# Patient Record
Sex: Female | Born: 1955 | Race: White | Hispanic: No | Marital: Married | Smoking: Never smoker
Health system: Southern US, Community
[De-identification: ages and names within clinical notes are randomized; demographics above are authoritative.]

## PROBLEM LIST (undated history)

## (undated) DIAGNOSIS — E039 Hypothyroidism, unspecified: Secondary | ICD-10-CM

## (undated) DIAGNOSIS — I480 Paroxysmal atrial fibrillation: Secondary | ICD-10-CM

## (undated) DIAGNOSIS — E785 Hyperlipidemia, unspecified: Secondary | ICD-10-CM

## (undated) DIAGNOSIS — M810 Age-related osteoporosis without current pathological fracture: Secondary | ICD-10-CM

## (undated) DIAGNOSIS — I4891 Unspecified atrial fibrillation: Secondary | ICD-10-CM

## (undated) HISTORY — PX: EYE SURGERY: SHX253

## (undated) HISTORY — DX: Hypothyroidism, unspecified: E03.9

## (undated) HISTORY — PX: WISDOM TOOTH EXTRACTION: SHX21

## (undated) HISTORY — PX: TONSILLECTOMY: SUR1361

## (undated) HISTORY — DX: Age-related osteoporosis without current pathological fracture: M81.0

## (undated) HISTORY — DX: Hyperlipidemia, unspecified: E78.5

## (undated) HISTORY — PX: UTERINE FIBROID SURGERY: SHX826

## (undated) HISTORY — DX: Paroxysmal atrial fibrillation: I48.0

## (undated) HISTORY — DX: Unspecified atrial fibrillation: I48.91

---

## 1998-09-01 ENCOUNTER — Other Ambulatory Visit: Admission: RE | Admit: 1998-09-01 | Discharge: 1998-09-01 | Payer: Self-pay | Admitting: Obstetrics and Gynecology

## 1999-10-18 ENCOUNTER — Other Ambulatory Visit: Admission: RE | Admit: 1999-10-18 | Discharge: 1999-10-18 | Payer: Self-pay | Admitting: Obstetrics and Gynecology

## 2000-10-17 ENCOUNTER — Other Ambulatory Visit: Admission: RE | Admit: 2000-10-17 | Discharge: 2000-10-17 | Payer: Self-pay | Admitting: Obstetrics and Gynecology

## 2001-10-17 ENCOUNTER — Other Ambulatory Visit: Admission: RE | Admit: 2001-10-17 | Discharge: 2001-10-17 | Payer: Self-pay | Admitting: Obstetrics and Gynecology

## 2002-04-03 ENCOUNTER — Ambulatory Visit (HOSPITAL_COMMUNITY): Admission: RE | Admit: 2002-04-03 | Discharge: 2002-04-03 | Payer: Self-pay | Admitting: Pulmonary Disease

## 2003-01-12 ENCOUNTER — Other Ambulatory Visit: Admission: RE | Admit: 2003-01-12 | Discharge: 2003-01-12 | Payer: Self-pay | Admitting: Obstetrics and Gynecology

## 2003-05-19 ENCOUNTER — Emergency Department (HOSPITAL_COMMUNITY): Admission: EM | Admit: 2003-05-19 | Discharge: 2003-05-19 | Payer: Self-pay | Admitting: Emergency Medicine

## 2003-05-19 ENCOUNTER — Encounter: Payer: Self-pay | Admitting: Emergency Medicine

## 2004-01-19 ENCOUNTER — Other Ambulatory Visit: Admission: RE | Admit: 2004-01-19 | Discharge: 2004-01-19 | Payer: Self-pay | Admitting: Obstetrics and Gynecology

## 2004-02-24 ENCOUNTER — Ambulatory Visit (HOSPITAL_COMMUNITY): Admission: RE | Admit: 2004-02-24 | Discharge: 2004-02-24 | Payer: Self-pay | Admitting: Family Medicine

## 2004-03-06 ENCOUNTER — Ambulatory Visit (HOSPITAL_COMMUNITY): Admission: RE | Admit: 2004-03-06 | Discharge: 2004-03-06 | Payer: Self-pay | Admitting: Family Medicine

## 2004-10-26 ENCOUNTER — Ambulatory Visit (HOSPITAL_COMMUNITY): Admission: RE | Admit: 2004-10-26 | Discharge: 2004-10-26 | Payer: Self-pay | Admitting: Pulmonary Disease

## 2005-05-23 ENCOUNTER — Other Ambulatory Visit: Admission: RE | Admit: 2005-05-23 | Discharge: 2005-05-23 | Payer: Self-pay | Admitting: Obstetrics and Gynecology

## 2009-07-04 ENCOUNTER — Ambulatory Visit (HOSPITAL_COMMUNITY): Admission: RE | Admit: 2009-07-04 | Discharge: 2009-07-04 | Payer: Self-pay | Admitting: Pulmonary Disease

## 2010-03-10 ENCOUNTER — Encounter (HOSPITAL_COMMUNITY): Admission: RE | Admit: 2010-03-10 | Discharge: 2010-04-09 | Payer: Self-pay | Admitting: Orthopaedic Surgery

## 2011-06-25 ENCOUNTER — Emergency Department (HOSPITAL_COMMUNITY)
Admission: EM | Admit: 2011-06-25 | Discharge: 2011-06-25 | Disposition: A | Discharge status: Home / Self Care | Payer: 59 | Attending: Emergency Medicine | Admitting: Emergency Medicine

## 2011-06-25 ENCOUNTER — Emergency Department (HOSPITAL_COMMUNITY): Payer: 59

## 2011-06-25 ENCOUNTER — Encounter: Payer: Self-pay | Admitting: Emergency Medicine

## 2011-06-25 DIAGNOSIS — R05 Cough: Secondary | ICD-10-CM | POA: Insufficient documentation

## 2011-06-25 DIAGNOSIS — R61 Generalized hyperhidrosis: Secondary | ICD-10-CM | POA: Insufficient documentation

## 2011-06-25 DIAGNOSIS — R059 Cough, unspecified: Secondary | ICD-10-CM | POA: Insufficient documentation

## 2011-06-25 DIAGNOSIS — J40 Bronchitis, not specified as acute or chronic: Secondary | ICD-10-CM | POA: Insufficient documentation

## 2011-06-25 DIAGNOSIS — R509 Fever, unspecified: Secondary | ICD-10-CM | POA: Insufficient documentation

## 2011-06-25 DIAGNOSIS — H9209 Otalgia, unspecified ear: Secondary | ICD-10-CM | POA: Insufficient documentation

## 2011-06-25 DIAGNOSIS — R6884 Jaw pain: Secondary | ICD-10-CM | POA: Insufficient documentation

## 2011-06-25 LAB — BASIC METABOLIC PANEL
BUN: 12 mg/dL (ref 6–23)
CO2: 30 mEq/L (ref 19–32)
Calcium: 9.4 mg/dL (ref 8.4–10.5)
Chloride: 98 mEq/L (ref 96–112)
Creatinine, Ser: 0.87 mg/dL (ref 0.50–1.10)
GFR calc non Af Amer: 74 mL/min — ABNORMAL LOW (ref >90)
Potassium: 3.9 mEq/L (ref 3.5–5.1)
Sodium: 139 mEq/L (ref 135–145)

## 2011-06-25 LAB — CBC
Hemoglobin: 13.6 g/dL (ref 12.0–15.0)
MCH: 29.4 pg (ref 26.0–34.0)
MCV: 89.6 fL (ref 78.0–100.0)
Platelets: 318 10*3/uL (ref 150–400)
RBC: 4.62 MIL/uL (ref 3.87–5.11)
WBC: 23.7 10*3/uL — ABNORMAL HIGH (ref 4.0–10.5)

## 2011-06-25 LAB — DIFFERENTIAL
Eosinophils Absolute: 0 10*3/uL (ref 0.0–0.7)
Eosinophils Relative: 0 % (ref 0–5)
Lymphocytes Relative: 5 % — ABNORMAL LOW (ref 12–46)
Monocytes Relative: 7 % (ref 3–12)

## 2011-06-25 MED ORDER — BENZONATATE 200 MG PO CAPS: 200.0000 mg | ORAL_CAPSULE | Freq: Three times a day (TID) | ORAL | Status: AC | PRN

## 2011-06-25 MED ORDER — MORPHINE SULFATE 4 MG/ML IJ SOLN
6.0000 mg | Freq: Once | INTRAMUSCULAR | Status: AC
  Administered 2011-06-25: 6 mg via INTRAVENOUS
  Filled 2011-06-25: qty 2

## 2011-06-25 MED ORDER — SODIUM CHLORIDE 0.9 % IV SOLN
INTRAVENOUS | Status: DC
  Administered 2011-06-25: 09:00:00 via INTRAVENOUS

## 2011-06-25 MED ORDER — OXYCODONE-ACETAMINOPHEN 5-325 MG PO TABS: 1.0000 | ORAL_TABLET | ORAL | Status: AC | PRN

## 2011-06-25 MED ORDER — ALBUTEROL 90 MCG/ACT IN AERS: 1.0000 | INHALATION_SPRAY | Freq: Four times a day (QID) | RESPIRATORY_TRACT | Status: DC | PRN

## 2011-06-25 NOTE — ED Provider Notes (Signed)
History     CSN: [id number] Arrival date & time: 06/25/2011  7:18 AM  Chief Complaint  Patient presents with  . Otalgia  . Cough    (Consider location/radiation/quality/duration/timing/severity/associated sxs/prior treatment) HPI Comments: This is a 55 YO Caucasian female presenting with cough and other cold symptoms for the past week and now last start, started experiencing severe right-sided facial pain.  Has been having non-productive cough, subjective fevers, and chills. No nausea/vomiting/diarrhea. No rash. No difficulty breathing. Patient does not smoke.   Last night, started experiencing significant right-sided facial pain. Difficult opening mouth and has been having difficulty eating because of this. Denies difficulty with vision or hearing.  Has only taken Alka-Seltzer cold. No other medications.    The history is provided by the patient and the spouse.    Past Medical History  Diagnosis Date  . Thyroid disease     Past Surgical History  Procedure Date  . Tonsillectomy     History reviewed. No pertinent family history.  History  Substance Use Topics  . Smoking status: Never Smoker   . Smokeless tobacco: Not on file  . Alcohol Use: No    OB History    Grav Para Term Preterm Abortions TAB SAB Ect Mult Living                  Review of Systems  Constitutional: Positive for fever, chills and diaphoresis.  HENT: Positive for congestion and rhinorrhea. Negative for hearing loss, ear pain, sore throat, facial swelling, sneezing, mouth sores, trouble swallowing, neck pain, neck stiffness, dental problem, sinus pressure, tinnitus and ear discharge.   Eyes: Negative for photophobia, pain, discharge, redness, itching and visual disturbance.  Respiratory: Positive for cough. Negative for choking, chest tightness, shortness of breath and wheezing.   Cardiovascular: Negative for chest pain and palpitations.  Gastrointestinal: Negative for nausea, vomiting, diarrhea  and constipation.  Neurological: Negative for speech difficulty, weakness, numbness and headaches.  Hematological: Negative for adenopathy.    Allergies  Penicillins  Home Medications  No current outpatient prescriptions on file.  BP 95/64  Pulse 87  Temp(Src) 98.9 F (37.2 C) (Axillary)  Resp 18  SpO2 100%  Physical Exam  Constitutional: She appears distressed.  HENT:  Head: Normocephalic and atraumatic.  Right Ear: Hearing, tympanic membrane, external ear and ear canal normal. No drainage or tenderness. No mastoid tenderness. Tympanic membrane is not injected, not scarred, not perforated, not erythematous, not retracted and not bulging. No middle ear effusion. No decreased hearing is noted.  Left Ear: Hearing, tympanic membrane, external ear and ear canal normal. No drainage or tenderness. No mastoid tenderness. Tympanic membrane is not injected, not scarred, not perforated, not erythematous, not retracted and not bulging.  No middle ear effusion. No decreased hearing is noted.  Nose: Nose normal. No mucosal edema or rhinorrhea.       Point tenderness on right face near right TMJ joint but not at joint.   Examination deferred. Patient unable to open her mouth due to pain.     Lymphadenopathy:    She has no cervical adenopathy.  Skin: She is not diaphoretic.    ED Course  Procedures (including critical care time)  Labs Reviewed - No data to display Dg Chest 2 View  06/25/2011  *RADIOLOGY REPORT*  Clinical Data: Cough.  CHEST - 2 VIEW  Comparison: None.  Findings: There is hyperinflation of the lungs compatible with COPD.  Scarring in the upper lobes. Heart and mediastinal contours are  within normal limits.  No focal opacities or effusions.  No acute bony abnormality.  IMPRESSION: COPD. No active cardiopulmonary disease.  Original Report Authenticated By: Cyndie Chime, M.D.     No diagnosis found.    MDM  This is a 55 YO Caucasian female whose past medical history  is only significant for thyroid problems that are stable on medications and who presents with cough and cold symptoms x 1 week and severe right-sided maxillary pain x 1 day.         Lucianne Muss [place] Resident 06/25/11 [phone]

## 2011-06-25 NOTE — ED Notes (Signed)
Prod cough noted.

## 2011-06-25 NOTE — ED Notes (Signed)
Pt c/o cough/congestion. Cold sx's started yesterday. Pain to r side of ear and jaw. States jaw is locked. Pt unable to open for temp.  nad at this itme. Pain worse with swallowing. Denies sorethroat.

## 2011-06-26 NOTE — ED Provider Notes (Signed)
I have personally seen and examined the patient.  I have discussed the plan of care with the resident.  I have reviewed the documentation on PMH/FH/Soc. History.  I have reviewed the documentation of the resident and agree. Seen by me pt with jaw pain and trunble opening mouth for severla days , als o with cough for severla day. . On exam alert handing secretions well non tooxic, heent positive trismus , bilat tms normal lungs cta,.   Pt's trismus resolved after iv pain medciation  Doug Sou, MD 06/26/11 1054

## 2011-08-02 ENCOUNTER — Other Ambulatory Visit (HOSPITAL_COMMUNITY): Payer: Self-pay | Admitting: Pulmonary Disease

## 2011-08-02 ENCOUNTER — Ambulatory Visit (HOSPITAL_COMMUNITY)
Admission: RE | Admit: 2011-08-02 | Discharge: 2011-08-02 | Disposition: A | Discharge status: Home / Self Care | Payer: 59 | Source: Ambulatory Visit | Attending: Pulmonary Disease | Admitting: Pulmonary Disease

## 2011-08-02 DIAGNOSIS — R52 Pain, unspecified: Secondary | ICD-10-CM

## 2011-08-02 DIAGNOSIS — M25539 Pain in unspecified wrist: Secondary | ICD-10-CM | POA: Insufficient documentation

## 2011-08-02 DIAGNOSIS — M25439 Effusion, unspecified wrist: Secondary | ICD-10-CM | POA: Insufficient documentation

## 2011-08-22 ENCOUNTER — Emergency Department (HOSPITAL_COMMUNITY): Payer: 59

## 2011-08-22 ENCOUNTER — Other Ambulatory Visit: Payer: Self-pay

## 2011-08-22 ENCOUNTER — Observation Stay (HOSPITAL_COMMUNITY)
Admission: EM | Admit: 2011-08-22 | Discharge: 2011-08-23 | DRG: 310 | Disposition: A | Discharge status: Home / Self Care | Payer: 59 | Attending: Pulmonary Disease | Admitting: Pulmonary Disease

## 2011-08-22 ENCOUNTER — Encounter (HOSPITAL_COMMUNITY): Payer: Self-pay | Admitting: *Deleted

## 2011-08-22 DIAGNOSIS — R55 Syncope and collapse: Secondary | ICD-10-CM | POA: Diagnosis present

## 2011-08-22 DIAGNOSIS — I471 Supraventricular tachycardia: Secondary | ICD-10-CM

## 2011-08-22 DIAGNOSIS — E039 Hypothyroidism, unspecified: Secondary | ICD-10-CM | POA: Diagnosis present

## 2011-08-22 DIAGNOSIS — I4892 Unspecified atrial flutter: Secondary | ICD-10-CM | POA: Diagnosis present

## 2011-08-22 DIAGNOSIS — I480 Paroxysmal atrial fibrillation: Secondary | ICD-10-CM | POA: Diagnosis present

## 2011-08-22 DIAGNOSIS — J111 Influenza due to unidentified influenza virus with other respiratory manifestations: Secondary | ICD-10-CM | POA: Diagnosis present

## 2011-08-22 DIAGNOSIS — J069 Acute upper respiratory infection, unspecified: Secondary | ICD-10-CM

## 2011-08-22 DIAGNOSIS — I4891 Unspecified atrial fibrillation: Secondary | ICD-10-CM | POA: Diagnosis present

## 2011-08-22 DIAGNOSIS — I498 Other specified cardiac arrhythmias: Principal | ICD-10-CM | POA: Diagnosis present

## 2011-08-22 DIAGNOSIS — E876 Hypokalemia: Secondary | ICD-10-CM | POA: Diagnosis present

## 2011-08-22 LAB — DIFFERENTIAL
Basophils Relative: 0 % (ref 0–1)
Eosinophils Relative: 0 % (ref 0–5)
Monocytes Relative: 7 % (ref 3–12)
Neutrophils Relative %: 87 % — ABNORMAL HIGH (ref 43–77)

## 2011-08-22 LAB — CARDIAC PANEL(CRET KIN+CKTOT+MB+TROPI)
Relative Index: 1.1 (ref 0.0–2.5)
Relative Index: 0.9 (ref 0.0–2.5)
Total CK: 281 U/L — ABNORMAL HIGH (ref 7–177)

## 2011-08-22 LAB — CBC
MCV: 89.8 fL (ref 78.0–100.0)
RBC: 4.3 MIL/uL (ref 3.87–5.11)

## 2011-08-22 LAB — INFLUENZA PANEL BY PCR (TYPE A & B)
H1N1 flu by pcr: NOT DETECTED
Influenza B By PCR: NEGATIVE

## 2011-08-22 LAB — COMPREHENSIVE METABOLIC PANEL
Alkaline Phosphatase: 54 U/L (ref 39–117)
Chloride: 102 mEq/L (ref 96–112)
GFR calc Af Amer: >90 mL/min (ref >90)
GFR calc non Af Amer: 80 mL/min — ABNORMAL LOW (ref >90)
Potassium: 2.9 mEq/L — ABNORMAL LOW (ref 3.5–5.1)
Total Bilirubin: 0.2 mg/dL — ABNORMAL LOW (ref 0.3–1.2)

## 2011-08-22 LAB — POCT I-STAT TROPONIN I: Troponin i, poc: 0.01 ng/mL (ref 0.00–0.08)

## 2011-08-22 LAB — MRSA PCR SCREENING: MRSA by PCR: NEGATIVE

## 2011-08-22 MED ORDER — POTASSIUM CHLORIDE 20 MEQ/15ML (10%) PO LIQD
ORAL | Status: AC
  Filled 2011-08-22: qty 60

## 2011-08-22 MED ORDER — ALUM & MAG HYDROXIDE-SIMETH 200-200-20 MG/5ML PO SUSP
30.0000 mL | Freq: Four times a day (QID) | ORAL | Status: DC | PRN
Start: 2011-08-22 — End: 2011-08-23

## 2011-08-22 MED ORDER — POTASSIUM CHLORIDE CRYS ER 20 MEQ PO TBCR
60.0000 meq | EXTENDED_RELEASE_TABLET | Freq: Once | ORAL | Status: DC
  Filled 2011-08-22: qty 3

## 2011-08-22 MED ORDER — POTASSIUM CHLORIDE 20 MEQ/15ML (10%) PO LIQD
60.0000 meq | Freq: Once | ORAL | Status: AC
  Administered 2011-08-22: 60 meq via ORAL

## 2011-08-22 MED ORDER — HYDROCODONE-ACETAMINOPHEN 5-325 MG PO TABS: 1.0000 | ORAL_TABLET | ORAL | Status: DC | PRN

## 2011-08-22 MED ORDER — ACETAMINOPHEN 325 MG PO TABS: 650.0000 mg | ORAL_TABLET | Freq: Four times a day (QID) | ORAL | Status: DC | PRN

## 2011-08-22 MED ORDER — ESTRADIOL-NORETHINDRONE ACET 0.05-0.14 MG/DAY TD PTTW: 1.0000 | MEDICATED_PATCH | TRANSDERMAL | Status: DC

## 2011-08-22 MED ORDER — SODIUM CHLORIDE 0.9 % IV SOLN
INTRAVENOUS | Status: DC
  Administered 2011-08-22: 21:00:00 via INTRAVENOUS
  Administered 2011-08-22: 125 mL/h via INTRAVENOUS
  Administered 2011-08-23: 05:00:00 via INTRAVENOUS

## 2011-08-22 MED ORDER — LEVOTHYROXINE SODIUM 25 MCG PO TABS
50.0000 ug | ORAL_TABLET | Freq: Every day | ORAL | Status: DC
  Administered 2011-08-23: 50 ug via ORAL
  Filled 2011-08-22 (×2): qty 2

## 2011-08-22 MED ORDER — ZOLPIDEM TARTRATE 5 MG PO TABS: 5.0000 mg | ORAL_TABLET | Freq: Every evening | ORAL | Status: DC | PRN

## 2011-08-22 MED ORDER — ONDANSETRON HCL 4 MG PO TABS: 4.0000 mg | ORAL_TABLET | Freq: Four times a day (QID) | ORAL | Status: DC | PRN

## 2011-08-22 MED ORDER — ONDANSETRON HCL 4 MG/2ML IJ SOLN: 4.0000 mg | Freq: Four times a day (QID) | INTRAMUSCULAR | Status: DC | PRN

## 2011-08-22 MED ORDER — OSELTAMIVIR PHOSPHATE 75 MG PO CAPS
75.0000 mg | ORAL_CAPSULE | Freq: Two times a day (BID) | ORAL | Status: DC
  Administered 2011-08-22 – 2011-08-23 (×3): 75 mg via ORAL
  Filled 2011-08-22 (×3): qty 1

## 2011-08-22 MED ORDER — ENOXAPARIN SODIUM 40 MG/0.4ML ~~LOC~~ SOLN
40.0000 mg | SUBCUTANEOUS | Status: DC
  Administered 2011-08-22: 40 mg via SUBCUTANEOUS
  Filled 2011-08-22: qty 0.4

## 2011-08-22 MED ORDER — ACETAMINOPHEN 650 MG RE SUPP: 650.0000 mg | Freq: Four times a day (QID) | RECTAL | Status: DC | PRN

## 2011-08-22 MED ORDER — MOXIFLOXACIN HCL IN NACL 400 MG/250ML IV SOLN
400.0000 mg | INTRAVENOUS | Status: DC
  Administered 2011-08-22: 400 mg via INTRAVENOUS
  Filled 2011-08-22 (×2): qty 250

## 2011-08-22 NOTE — Progress Notes (Addendum)
Lab called with critical. Patient influenza screen came back positive for Influenza A.

## 2011-08-22 NOTE — ED Notes (Signed)
Pt passed out at home per family at approximately 0645. Pt had CPR by family for 5 minutes but pulselessness was not confirmed. Pt was alert and oriented on arrival by EMS. Pt was in rapid A fib vs SVT and was given Adenosine 6 mg at 0700 and 12 mg at 0703. Pt remained in SVT and was given Cardizem 20 mg at 0715. Pt converted to A fib with rate of 104. Pt alert and oriented x 3 on arrival to ED. CCM showing NSR with rate of 85. Denies chest pain or shortness of breath. Pt states that she has been sick and had a fever up to 102.5 off and on for the last couple days.

## 2011-08-22 NOTE — ED Notes (Signed)
Pt states that she has been sick with a fever and nausea x 2 days. Pt states that she got up this morning and remembers sitting down at the table and then does not remember anything else. CCM showing NSR with rate 85. Alert and oriented x 3. Skin warm and dry. Color pink. Husband at bedside.

## 2011-08-22 NOTE — ED Provider Notes (Signed)
History   This chart was scribed for [employer]. Colon Branch, MD by Sofie Rower. The patient was seen in room [facility]18/[facility]18 and the patient's care was started at 7:57AM.    CSN: [id number] Arrival date & time: 08/22/2011  7:38 AM   First MD Initiated Contact with Patient 08/22/11 [phone]      Chief Complaint  Patient presents with  . Loss of Consciousness    (Consider location/radiation/quality/duration/timing/severity/associated sxs/prior treatment) HPI  MARGARETHA MAHAN is a 55 y.o. female who presents to the Emergency Department after BIB EMS complaining of a severe episode of loss of consciousness this morning with associated symptoms including cough, weakness, palpitations, and headache to posterior aspect of head. Pt. Denies nausea and sore throat.   Pt. States she was sitting at the table drinking pepsi, where her husband heard her groan. Husband states she was not displaying any seizure activity with LOC but was unresponsive and was disoriented when she came to. Pt's Husband began administering CPR with chest compression and rescue breaths at time of LOC. Patient also notes experiencing fever measured as high as 102.7, chills and cough for the past several days prior to episode of LOC this morning.  PCP is Dr. Juanetta Gosling.  Past Medical History  Diagnosis Date  . Thyroid disease     Past Surgical History  Procedure Date  . Tonsillectomy     History reviewed. No pertinent family history.  History  Substance Use Topics  . Smoking status: Never Smoker   . Smokeless tobacco: Not on file  . Alcohol Use: No    OB History    Grav Para Term Preterm Abortions TAB SAB Ect Mult Living                  Review of Systems  10 Systems reviewed and are negative for acute change except as noted in the HPI.   Allergies  Other and Penicillins  Home Medications   Current Outpatient Rx  Name Route Sig Dispense Refill  . ESTRADIOL-NORETHINDRONE ACET 0.05-0.14 MG/DAY TD PTTW Transdermal Place 1  patch onto the skin 2 (two) times a week. saturdays     . LEVOTHYROXINE SODIUM 50 MCG PO TABS Oral Take 50 mcg by mouth daily.      . ALBUTEROL 90 MCG/ACT IN AERS Inhalation Inhale 1-2 puffs into the lungs every 6 (six) hours as needed for wheezing (Cough). 17 g 1    BP 105/68  Pulse 91  Temp(Src) 98.5 F (36.9 C) (Oral)  Resp 18  Ht 5\' 5"  (1.651 m)  Wt 110 lb (49.896 kg)  BMI 18.31 kg/m2  SpO2 96%  Physical Exam  Nursing note and vitals reviewed. Constitutional: She is oriented to person, place, and time. She appears well-developed and well-nourished. No distress.  HENT:  Head: Normocephalic and atraumatic.  Mouth/Throat: Oropharynx is clear and moist.  Eyes: EOM are normal. Pupils are equal, round, and reactive to light.  Neck: Normal range of motion. Neck supple. No tracheal deviation present.  Cardiovascular: Normal rate and regular rhythm.  Exam reveals no gallop and no friction rub.   No murmur heard. Pulmonary/Chest: Effort normal. No respiratory distress. She has wheezes (left upper lobe). She has rales (crackles at both bases bilaterally).  Abdominal: Soft. Bowel sounds are normal. She exhibits no distension.  Musculoskeletal: Normal range of motion. She exhibits no edema.  Lymphadenopathy:    She has no cervical adenopathy.  Neurological: She is alert and oriented to person, place, and time. No  sensory deficit.  Skin: Skin is warm and dry.  Psychiatric: She has a normal mood and affect. Her behavior is normal.    ED Course  Procedures (including critical care time)     Results for orders placed during the hospital encounter of 06/25/11  BASIC METABOLIC PANEL      Component Value Range   Sodium 139  135 - 145 (mEq/L)   Potassium 3.9  3.5 - 5.1 (mEq/L)   Chloride 98  96 - 112 (mEq/L)   CO2 30  19 - 32 (mEq/L)   Glucose, Bld 136 (*) 70 - 99 (mg/dL)   BUN 12  6 - 23 (mg/dL)   Creatinine, Ser 1.61  0.50 - 1.10 (mg/dL)   Calcium 9.4  8.4 - 09.6 (mg/dL)   GFR  calc non Af Amer 74 (*) >90 (mL/min)   GFR calc Af Amer 85 (*) >90 (mL/min)  CBC      Component Value Range   WBC 23.7 (*) 4.0 - 10.5 (K/uL)   RBC 4.62  3.87 - 5.11 (MIL/uL)   Hemoglobin 13.6  12.0 - 15.0 (g/dL)   HCT 04.5  40.9 - 81.1 (%)   MCV 89.6  78.0 - 100.0 (fL)   MCH 29.4  26.0 - 34.0 (pg)   MCHC 32.9  30.0 - 36.0 (g/dL)   RDW 91.4  78.2 - 95.6 (%)   Platelets 318  150 - 400 (K/uL)  DIFFERENTIAL      Component Value Range   Neutrophils Relative 88 (*) 43 - 77 (%)   Neutro Abs 20.8 (*) 1.7 - 7.7 (K/uL)   Lymphocytes Relative 5 (*) 12 - 46 (%)   Lymphs Abs 1.2  0.7 - 4.0 (K/uL)   Monocytes Relative 7  3 - 12 (%)   Monocytes Absolute 1.6 (*) 0.1 - 1.0 (K/uL)   Eosinophils Relative 0  0 - 5 (%)   Eosinophils Absolute 0.0  0.0 - 0.7 (K/uL)   Basophils Relative 0  0 - 1 (%)   Basophils Absolute 0.1  0.0 - 0.1 (K/uL)    DIAGNOSTIC STUDIES: Oxygen Saturation is 96% on room air, normal by my interpretation.    Date: 08/22/2011  2130  Rate: 90  Rhythm: normal sinus rhythm  QRS Axis: normal  Intervals: normal  ST/T Wave abnormalities: normal and nonspecific ST changes  Conduction Disutrbances:right bundle branch block  Narrative Interpretation:   Old EKG Reviewed: none available No results found. COORDINATION OF CARE:  8:03AM- EDP at bedside discusses treatment plan. 9:03AM- Consult compelte with Dr. Lendon Colonel. Dr. Juanetta Gosling agrees to evaluate patient in ED 9:15AM- Dr. Juanetta Gosling at patient bedside. Will admit patient to step-down.   MDM  Patient with recent URI symptoms with fevers had syncope with unresponsiveness. Husband initiated CPR with restoration of response. EMS responded and found patient in atrial fib vs SVT. Tried adenosine without conversion, gave cardiezem with conversion to NSR. Patient in NSR upon arrival in ER. Initiated IVF, labs unresmarkable , EKG normal. Spoke with Dr. Blase Mess who will see and admit patient from the ER.  I personally performed the services  described in this documentation, which was scribed in my presence. The recorded information has been reviewed and considered.    MDM Reviewed: previous chart, nursing note and vitals Reviewed previous: ECG Interpretation: labs, ECG and x-ray Total time providing critical care: 35. Consults: primary care provider          Nicoletta Dress. Colon Branch, MD 08/24/11 1112

## 2011-08-22 NOTE — H&P (Signed)
Allison Koch MRN: [id number] DOB/AGE: 55-22-1957 55 y.o. Primary Care Physician:Henrry Feil L, MD Admit date: 08/22/2011 Chief Complaint: Supraventricular tachycardia HPI: This is a 55 year old who has been battling an upper respiratory infection which may be influenza. She has had a temperature as high as 102 has been coughing and aching all over. She has been taking over the Alka-Seltzer. This morning her husband heard her cry out and went downstairs and found her collapsed. He could not tell that she was breathing. He called for 911 and started CPR. He did not see if she had a pulse at that time. He clear lung secretions out of her throat and she started to come around. When the EMS arrived she was found to be in a supraventricular tachycardia with a rapid response.  Past Medical History  Diagnosis Date  . Thyroid disease    Past Surgical History  Procedure Date  . Tonsillectomy         History reviewed. No pertinent family history. Her mother has had atrial fibrillation and has COPD Social History:  reports that she has never smoked. She does not have any smokeless tobacco history on file. She reports that she does not drink alcohol or use illicit drugs. She lives at home with her husband  Allergies:  Allergies  Allergen Reactions  . Other     Mycins per patient.  . Penicillins     Medications Prior to Admission  Medication Dose Route Frequency Provider Last Rate Last Dose  . potassium chloride 20 MEQ/15ML (10%) liquid 60 mEq  60 mEq Oral Once [employer]. Colon Branch, MD      . DISCONTD: potassium chloride 20 MEQ/15ML (10%) liquid           . DISCONTD: potassium chloride SA (K-DUR,KLOR-CON) CR tablet 60 mEq  60 mEq Oral Once [employer]. Colon Branch, MD       Medications Prior to Admission  Medication Sig Dispense Refill  . estradiol-norethindrone (COMBIPATCH) 0.05-0.14 MG/DAY Place 1 patch onto the skin 2 (two) times a week. saturdays       . levothyroxine (SYNTHROID, LEVOTHROID) 50  MCG tablet Take 50 mcg by mouth daily.        Marland Kitchen albuterol (PROVENTIL,VENTOLIN) 90 MCG/ACT inhaler Inhale 1-2 puffs into the lungs every 6 (six) hours as needed for wheezing (Cough).  17 g  1       NWG:NFAOZ from the symptoms mentioned above,there are no other symptoms referable to all systems reviewed.  Physical Exam: Blood pressure 105/68, pulse 91, temperature 98.5 F (36.9 C), temperature source Oral, resp. rate 18, height 5\' 5"  (1.651 m), weight 49.896 kg (110 lb), SpO2 96.00%. She is awake and alert. Her pupils are reactive. Her mucous membranes are slightly dry. Her neck is supple without masses. Her chest is clear. Her heart is regular without gallop. She is now in sinus rhythm. Her abdomen is soft without masses. Bowel sounds present and active. Her extremities showed no clubbing cyanosis or edema. Her central nervous system examination is grossly intact.    Basename 08/22/11 0828  WBC 10.5  NEUTROABS 9.2*  HGB 13.1  HCT 38.6  MCV 89.8  PLT 203    Basename 08/22/11 0828  NA 139  K 2.9*  CL 102  CO2 28  GLUCOSE 135*  BUN 8  CREATININE 0.81  CALCIUM 9.1  MG --  lablast2(ast:2,ALT:2,alkphos:2,bilitot:2,prot:2,albumin:2)@    No results found for this or any previous visit (from the past 240 hour(s)).   Dg Wrist Complete  Left  08/02/2011  *RADIOLOGY REPORT*  Clinical Data: 55 year old female with pain and swelling.  LEFT WRIST - COMPLETE 3+ VIEW  Comparison: None.  Findings: Distal left radius and ulna are intact.  There is radiocarpal joint space narrowing with subchondral sclerosis. There is mild intracarpal joint space narrowing with subchondral sclerosis.  Carpometacarpal joint spaces appear preserved.  Carpal bone alignment is within normal limits.  The scaphoid appears intact.  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  Original Report Authenticated By: Harley Hallmark, M.D.   Dg Chest Port  1 View  08/22/2011  *RADIOLOGY REPORT*  Clinical Data: Supraventricular tachycardia.  Chest pain.  PORTABLE CHEST - 1 VIEW  Comparison: 06/25/2011  Findings: Midline trachea.  Normal heart size and mediastinal contours for age.  No pleural effusion or pneumothorax.  COPD with biapical pleural parenchymal scarring.  This is unchanged.  No superimposed airspace disease. No congestive failure.  IMPRESSION: COPD with biapical pleural parenchymal scarring. No acute findings.  Original Report Authenticated By: Consuello Bossier, M.D.   Impression: She has supraventricular tachycardia. She is hypokalemic. She had a previous episode about 2 months ago where she almost passed out. That may have been a previous episode of SVT. She has a history of thyroid disease we'll need to check her TSH. Active Problems:  * No active hospital problems. *      Plan: She'll be admitted to step down have cycled cardiac enzymes. She needs an echocardiogram. She will be treated as if she has influenza. She will have cardiology consultation      Legend Pecore L 08/22/2011, 9:31 AM

## 2011-08-22 NOTE — ED Notes (Signed)
Pt transported to ICU via stretcher.

## 2011-08-22 NOTE — Consult Note (Signed)
Reason for Consult SVT syncope  Referring Physician: Cyndy Braver is an 55 y.o. female.  HPI: This is a 55 year old white female patient who has no prior cardiac history. She has been battling an upper respiratory infection with nonproductive cough and fever as high as 102.  This morning her husband heard her cry out and went downstairs and found her collapsed in her chair.  Patient recalls coming to the kitchen to pour something to drink and taking a few swallows. She does not remember lightheadedness or any other symptoms prior to loss of consciousness.   He did not detect a definite respiratory effort, called for 911 and then started CPR. He did not check for a pulse. He cleared secretions out of her throat, after which she regained consciousness. When the EMS arrived she was found to be in a supraventricular tachycardia with a rapid response. They gave her adenosine 18 mg, which resulted in marked slowing of her heart rate and emergency clear flutter waves.   3 minutes later, apparently after a second dose of adenosine, there was a 10 second pause with an underlying rhythm of atrial fibrillation.   She subsequently was given Cardizem 20 mg.  Eventually, she converted to sinus rhythm.  The patient states she awakened 45 minutes prior to this episode to go to the bathroom and felt dizzy like she was going to black out but it passed. She also noticed her heart was racing at that time. She did not feel her heart racing when she collapsed. She denies any prior history of palpitations, chest pain, dyspnea, dyspnea on exertion, dizziness, or presyncope. She was taking Alka-Seltzer cold for her cold symptoms.  On arrival to the hospital her potassium was 2.9.  She claims that oral intake was preserved throughout her illness, but that she may have concentrated more on fluids than food.  Past Medical History  Diagnosis Date  . Thyroid disease    Past Surgical History  Procedure Date  .  Tonsillectomy   Family history: patient's mother has a history of atrial fibrillation and is followed by Dr. Daleen Squibb, patient's father had CABG and died of heart failure.  Social History:  reports that she has never smoked. She does not have any smokeless tobacco history on file. She reports that she does not drink alcohol or use illicit drugs.  Allergies:  Allergies  Allergen Reactions  . Other     Mycins per patient.  . Penicillins    Results for orders placed during the hospital encounter of 08/22/11 (from the past 48 hour(s))  CBC     Status: Normal   Collection Time   08/22/11  8:28 AM      Component Value Range Comment   WBC 10.5  4.0 - 10.5 (K/uL)    RBC 4.30  3.87 - 5.11 (MIL/uL)    Hemoglobin 13.1  12.0 - 15.0 (g/dL)    HCT 16.1  09.6 - 04.5 (%)    MCV 89.8  78.0 - 100.0 (fL)    MCH 30.5  26.0 - 34.0 (pg)    MCHC 33.9  30.0 - 36.0 (g/dL)    RDW 40.9  81.1 - 91.4 (%)    Platelets 203  150 - 400 (K/uL)   COMPREHENSIVE METABOLIC PANEL     Status: Abnormal   Collection Time   08/22/11  8:28 AM      Component Value Range Comment   Sodium 139  135 - 145 (mEq/L)  Potassium 2.9 (*) 3.5 - 5.1 (mEq/L)    Chloride 102  96 - 112 (mEq/L)    CO2 28  19 - 32 (mEq/L)    Glucose, Bld 135 (*) 70 - 99 (mg/dL)    BUN 8  6 - 23 (mg/dL)    Creatinine, Ser 1.61  0.50 - 1.10 (mg/dL)    Calcium 9.1  8.4 - 10.5 (mg/dL)    Total Protein 6.5  6.0 - 8.3 (g/dL)    Albumin 3.3 (*) 3.5 - 5.2 (g/dL)    AST 26  0 - 37 (U/L)    ALT 9  0 - 35 (U/L)    Alkaline Phosphatase 54  39 - 117 (U/L)    Total Bilirubin 0.2 (*) 0.3 - 1.2 (mg/dL)    GFR calc non Af Amer 80 (*) >90 (mL/min)    GFR calc Af Amer >90  >90 (mL/min)   POCT I-STAT TROPONIN I     Status: Normal   Collection Time   08/22/11  8:51 AM      Component Value Range Comment   Troponin i, poc 0.01  0.00 - 0.08 (ng/mL)    Comment 3            CARDIAC PANEL(CRET KIN+CKTOT+MB+TROPI)     Status: Abnormal   Collection Time   08/22/11 11:36 AM        Component Value Range Comment   Total CK 281 (*) 7 - 177 (U/L)    CK, MB 3.2  0.3 - 4.0 (ng/mL)    Troponin I <0.30  <0.30 (ng/mL)    Relative Index 1.1  0.0 - 2.5      Dg Chest Port 1 View  08/22/2011  *RADIOLOGY REPORT*  Clinical Data: Supraventricular tachycardia.  Chest pain.  PORTABLE CHEST - 1 VIEW  Comparison: 06/25/2011  Findings: Midline trachea.  Normal heart size and mediastinal contours for age.  No pleural effusion or pneumothorax.  COPD with biapical pleural parenchymal scarring.  This is unchanged.  No superimposed airspace disease. No congestive failure.  IMPRESSION: COPD with biapical pleural parenchymal scarring. No acute findings.  Original Report Authenticated By: Consuello Bossier, M.D.    ROS See HPI Eyes: Negative Ears:Negative for hearing loss, tinnitus Cardiovascular: Negative for chest pain,  dyspnea, dyspnea on exertion, near-syncope, orthopnea, paroxysmal nocturnal dyspnia and syncope,edema, claudication, cyanosis,.  Respiratory:  Positive for cough and congestion over the past week with increased sputum production,  Negative for  hemoptysis, shortness of breath,and wheezing.   Endocrine: Negative for cold intolerance and heat intolerance.  Hematologic/Lymphatic: Negative for adenopathy and bleeding problem. Does not bruise/bleed easily.  Musculoskeletal: Negative.   Gastrointestinal: Negative for nausea, vomiting, reflux, abdominal pain, diarrhea, constipation.   Neurological: Negative.  Allergic/Immunologic: Negative for environmental allergies.  Blood pressure 108/57, pulse 88, temperature 98.2 F (36.8 C), temperature source Oral, resp. rate 21, height 5\' 5"  (1.651 m), weight 108 lb 7.5 oz (49.2 kg), SpO2 100.00%. Physical Exam PHYSICAL EXAM: Well-nournished, in no acute distress. Neck: No JVD, HJR, bilateral early systolic carotid bruits, or thyroid enlargement Lungs: No tachypnea, clear without wheezing, rales, or rhonchi Cardiovascular: RRR, PMI not  displaced, heart sounds normal, no murmurs, gallops, bruit, thrill, or heave. Abdomen: BS normal. Soft without organomegaly, masses, lesions or tenderness. Extremities: without cyanosis, clubbing or edema. Good distal pulses bilateral SKin: Warm, no lesions or rashes  Musculoskeletal: No deformities Neuro: no focal signs  EKG: normal sinus rhythm with incomplete right bundle branch block and ST  depression inferolaterally Echocardiogram: Normal left ventricular systolic function; no significant valvular abnormalities  Assessment/Plan: SVT/atrial fibrillation: currently in normal sinus rhythm Syncope; Patient collapsed in the setting of SVT and a potassium of 2.9 while coughing up thick secretions. rule out ischemia. 2-D echo pending, initial troponin normal. Upper respiratory infection Hypokalemia: potassium 2.9 on admission, being replaced.  Jacolyn Reedy 08/22/2011, 1:20 PM    Cardiology Attending Patient interviewed and examined. Discussed with Jacolyn Reedy, PA-C.  Above note annotated and modified based upon my findings.  Patient has tested positive for influenza with an illness that is consistent with that diagnosis. She was hypokalemic on admission, likely as a result of excessive fluid replacement without adequate intake of nutrients. She had had a high fever, had decreased caloric intake and was likely somewhat dehydrated, all of which may have contributed to what was likely a syncopal spell. Since she did not collapse to the floor when she loss consciousness, the episode was likely exacerbated and prolonged. Atrial fibrillation was likely response to her pulmonary and viral infection, but also contributed to cerebral hypoperfusion.  A TSH level will be measured to exclude hyperthyroidism. Medications will be adjusted to control heart rate if atrial fibrillation recurs. Patient can be discharged when she symptoms are adequately managed.  Hanson Bing, MD 08/22/2011, 7:01 PM

## 2011-08-22 NOTE — Progress Notes (Signed)
*  PRELIMINARY RESULTS* Echocardiogram 2D Echocardiogram has been performed.  Allison Koch 08/22/2011, 1:28 PM

## 2011-08-23 DIAGNOSIS — I4891 Unspecified atrial fibrillation: Secondary | ICD-10-CM

## 2011-08-23 DIAGNOSIS — E876 Hypokalemia: Secondary | ICD-10-CM | POA: Diagnosis present

## 2011-08-23 DIAGNOSIS — J111 Influenza due to unidentified influenza virus with other respiratory manifestations: Secondary | ICD-10-CM | POA: Diagnosis present

## 2011-08-23 DIAGNOSIS — R55 Syncope and collapse: Secondary | ICD-10-CM | POA: Diagnosis present

## 2011-08-23 DIAGNOSIS — E039 Hypothyroidism, unspecified: Secondary | ICD-10-CM | POA: Diagnosis present

## 2011-08-23 DIAGNOSIS — I480 Paroxysmal atrial fibrillation: Secondary | ICD-10-CM | POA: Diagnosis present

## 2011-08-23 LAB — BASIC METABOLIC PANEL
CO2: 23 mEq/L (ref 19–32)
Calcium: 8 mg/dL — ABNORMAL LOW (ref 8.4–10.5)
GFR calc non Af Amer: >90 mL/min (ref >90)
Potassium: 3 mEq/L — ABNORMAL LOW (ref 3.5–5.1)
Sodium: 138 mEq/L (ref 135–145)

## 2011-08-23 LAB — CARDIAC PANEL(CRET KIN+CKTOT+MB+TROPI)
Relative Index: 0.8 (ref 0.0–2.5)
Troponin I: <0.3 ng/mL (ref <0.30)

## 2011-08-23 MED ORDER — METOPROLOL SUCCINATE ER 25 MG PO TB24
25.0000 mg | ORAL_TABLET | Freq: Every day | ORAL | Status: DC
  Administered 2011-08-23: 25 mg via ORAL
  Filled 2011-08-23: qty 1

## 2011-08-23 MED ORDER — POTASSIUM CHLORIDE 20 MEQ PO PACK
40.0000 meq | PACK | Freq: Every day | ORAL | Status: DC
  Filled 2011-08-23 (×2): qty 2

## 2011-08-23 MED ORDER — OSELTAMIVIR PHOSPHATE 75 MG PO CAPS: 75.0000 mg | ORAL_CAPSULE | Freq: Two times a day (BID) | ORAL | Status: AC

## 2011-08-23 MED ORDER — POT BICARB-POT CHLORIDE 25 MEQ PO TBEF: 25.0000 meq | EFFERVESCENT_TABLET | Freq: Every day | ORAL | Status: DC

## 2011-08-23 MED ORDER — METOPROLOL SUCCINATE ER 25 MG PO TB24: 25.0000 mg | ORAL_TABLET | Freq: Every day | ORAL | Status: DC

## 2011-08-23 MED ORDER — POTASSIUM CHLORIDE CRYS ER 20 MEQ PO TBCR: 40.0000 meq | EXTENDED_RELEASE_TABLET | Freq: Once | ORAL | Status: DC

## 2011-08-23 MED ORDER — MOXIFLOXACIN HCL 400 MG PO TABS: 400.0000 mg | ORAL_TABLET | Freq: Every day | ORAL | Status: AC

## 2011-08-23 NOTE — Progress Notes (Signed)
Subjective: She feels better. She was positive for influenza. She has not had any further episodes of SVT.  Objective: Vital signs in last 24 hours: Temp:  [98.2 F (36.8 C)-99 F (37.2 C)] 99 F (37.2 C) (12/06 0700) Pulse Rate:  [75-95] 90  (12/06 0700) Resp:  [13-28] 21  (12/06 0700) BP: (99-144)/(48-90) 133/59 mmHg (12/06 0700) SpO2:  [96 %-100 %] 98 % (12/06 0700) Weight:  [49.2 kg (108 lb 7.5 oz)-51.7 kg (113 lb 15.7 oz)] 113 lb 15.7 oz (51.7 kg) (12/06 0500) Weight change:  Last BM Date:  (Prior to admission)  Intake/Output from previous day: 12/05 0701 - 12/06 0700 In: 3018.3 [P.O.:460; I.V.:2308.3; IV Piggyback:250] Out: -   PHYSICAL EXAM General appearance: alert, cooperative and no distress Resp: clear to auscultation bilaterally Cardio: regular rate and rhythm, S1, S2 normal, no murmur, click, rub or gallop GI: soft, non-tender; bowel sounds normal; no masses,  no organomegaly Extremities: extremities normal, atraumatic, no cyanosis or edema  Lab Results:    Basic Metabolic Panel:  Basename 08/23/11 0354 08/22/11 0828  NA 138 139  K 3.0* 2.9*  CL 107 102  CO2 23 28  GLUCOSE 106* 135*  BUN 4* 8  CREATININE 0.75 0.81  CALCIUM 8.0* 9.1  MG -- --  PHOS -- --   Liver Function Tests:  [facility] 08/22/11 0828  AST 26  ALT 9  ALKPHOS 54  BILITOT 0.2*  PROT 6.5  ALBUMIN 3.3*   No results found for this basename: LIPASE:2,AMYLASE:2 in the last 72 hours No results found for this basename: AMMONIA:2 in the last 72 hours CBC:  Basename 08/22/11 0828  WBC 10.5  NEUTROABS 9.2*  HGB 13.1  HCT 38.6  MCV 89.8  PLT 203   Cardiac Enzymes:  Basename 08/23/11 0354 08/22/11 1851 08/22/11 1136  CKTOTAL 334* 333* 281*  CKMB 2.7 2.9 3.2  CKMBINDEX -- -- --  TROPONINI <0.30 <0.30 <0.30   BNP: No results found for this basename: POCBNP:3 in the last 72 hours D-Dimer: No results found for this basename: DDIMER:2 in the last 72 hours CBG: No results  found for this basename: GLUCAP:6 in the last 72 hours Hemoglobin A1C: No results found for this basename: HGBA1C in the last 72 hours Fasting Lipid Panel: No results found for this basename: CHOL,HDL,LDLCALC,TRIG,CHOLHDL,LDLDIRECT in the last 72 hours Thyroid Function Tests:  Basename 08/22/11 0828  TSH 1.013  T4TOTAL --  FREET4 --  T3FREE --  THYROIDAB --   Anemia Panel: No results found for this basename: VITAMINB12,FOLATE,FERRITIN,TIBC,IRON,RETICCTPCT in the last 72 hours Coagulation: No results found for this basename: LABPROT:2,INR:2 in the last 72 hours Urine Drug Screen: Drugs of Abuse  No results found for this basename: labopia, cocainscrnur, labbenz, amphetmu, thcu, labbarb    Alcohol Level: No results found for this basename: ETH:2 in the last 72 hours Urinalysis:  Misc. Labs:  ABGS No results found for this basename: PHART,PCO2,PO2ART,TCO2,HCO3 in the last 72 hours CULTURES Recent Results (from the past 240 hour(s))  MRSA PCR SCREENING     Status: Normal   Collection Time   08/22/11 11:20 AM      Component Value Range Status Comment   MRSA by PCR NEGATIVE  NEGATIVE  Final    Studies/Results: Dg Chest Port 1 View  08/22/2011  *RADIOLOGY REPORT*  Clinical Data: Supraventricular tachycardia.  Chest pain.  PORTABLE CHEST - 1 VIEW  Comparison: 06/25/2011  Findings: Midline trachea.  Normal heart size and mediastinal contours for age.  No pleural effusion or pneumothorax.  COPD with biapical pleural parenchymal scarring.  This is unchanged.  No superimposed airspace disease. No congestive failure.  IMPRESSION: COPD with biapical pleural parenchymal scarring. No acute findings.  Original Report Authenticated By: Consuello Bossier, M.D.    Medications:  Prior to Admission:  Prescriptions prior to admission  Medication Sig Dispense Refill  . Calcium Carbonate-Vitamin D 600-400 MG-UNIT per chew tablet Chew 1 tablet by mouth 2 (two) times daily.        Marland Kitchen  estradiol-norethindrone (COMBIPATCH) 0.05-0.14 MG/DAY Place 1 patch onto the skin 2 (two) times a week. saturdays       . ibuprofen (ADVIL,MOTRIN) 200 MG tablet Take 600 mg by mouth every 8 (eight) hours as needed. inflammation       . levothyroxine (SYNTHROID, LEVOTHROID) 50 MCG tablet Take 50 mcg by mouth daily.        Marland Kitchen albuterol (PROVENTIL,VENTOLIN) 90 MCG/ACT inhaler Inhale 1-2 puffs into the lungs every 6 (six) hours as needed for wheezing (Cough).  17 g  1   Scheduled:   . enoxaparin  40 mg Subcutaneous Q24H  . estradiol-norethindrone  1 patch Transdermal 2 times weekly  . levothyroxine  50 mcg Oral Daily  . moxifloxacin  400 mg Intravenous Q24H  . oseltamivir  75 mg Oral BID  . potassium chloride  40 mEq Oral Daily  . potassium chloride  60 mEq Oral Once  . DISCONTD: potassium chloride      . DISCONTD: potassium chloride  40 mEq Oral Once  . DISCONTD: potassium chloride SA  60 mEq Oral Once   Continuous:   . sodium chloride 125 mL/hr at 08/23/11 0800   ZOX:[id number], acetaminophen, alum & mag hydroxide-simeth, HYDROcodone-acetaminophen, ondansetron (ZOFRAN) IV, ondansetron, zolpidem  Assesment: She has influenza. She had a SVT. She has hypothyroidism. She is mildly hypokalemic still Active Problems:  * No active hospital problems. *     Plan: She probably should can go home. She will have her potassium replaced.    LOS: 1 day   Allison Koch L 08/23/2011, 8:14 AM

## 2011-08-23 NOTE — Progress Notes (Signed)
CARE MANAGEMENT NOTE 08/23/2011  Patient:  Allison Koch, Allison Koch   Account Number:  [id number]  Date Initiated:  08/23/2011  Documentation initiated by:  Rosemary Holms  Subjective/Objective Assessment:   Pt admitted from home with loss of consciouness. Lived independently with spouse prior to admission     Action/Plan:   Spoke with pt and family at bedside. No HH needs identified.   Anticipated DC Date:  08/23/2011   Anticipated DC Plan:  HOME/SELF CARE      DC Planning Services  CM consult      Choice offered to / List presented to:             Status of service:  Completed, signed off Medicare Important Message given?  YES (If response is "NO", the following Medicare IM given date fields will be blank) Date Medicare IM given:  08/23/2011 Date Additional Medicare IM given:    Discharge Disposition:  HOME/SELF CARE  Per UR Regulation:    Comments:  08/23/11 1300 Ciarrah Rae Leanord Hawking RN BSN

## 2011-08-23 NOTE — Progress Notes (Signed)
Utilization review completed.  

## 2011-08-23 NOTE — Progress Notes (Signed)
Subjective: Feels better. No palpitations. No chest pain. Wants to go home.   Objective: Temp:  [98.2 F (36.8 C)-99 F (37.2 C)] 99 F (37.2 C) (12/06 0700) Pulse Rate:  [75-95] 90  (12/06 0700) Resp:  [13-28] 21  (12/06 0700) BP: (99-144)/(48-90) 133/59 mmHg (12/06 0700) SpO2:  [96 %-100 %] 98 % (12/06 0700) Weight:  [108 lb 7.5 oz (49.2 kg)-113 lb 15.7 oz (51.7 kg)] 113 lb 15.7 oz (51.7 kg) (12/06 0500)  I/O last 3 completed shifts: In: 3018.3 [P.O.:460; I.V.:2308.3; IV Piggyback:250] Out: -   Telemetry - Sinus rhythm.  Exam -   General - NAD.  Lungs - Clear.  Cardiac - Regular rate and rhythm.  Abdomen - NABS.  Extremities - No pitting.  Testing -   Lab Results  Component Value Date   WBC 10.5 08/22/2011   HGB 13.1 08/22/2011   HCT 38.6 08/22/2011   MCV 89.8 08/22/2011   PLT 203 08/22/2011    Lab Results  Component Value Date   CREATININE 0.75 08/23/2011   BUN 4* 08/23/2011   NA 138 08/23/2011   K 3.0* 08/23/2011   CL 107 08/23/2011   CO2 23 08/23/2011    Lab Results  Component Value Date   CKTOTAL 334* 08/23/2011   CKMB 2.7 08/23/2011   TROPONINI <0.30 08/23/2011    Echocardiogram  - Left ventricle: The cavity size was normal. Borderline concentric hypertrophy. Systolic function was vigorous. The estimated ejection fraction was in the range of 65% to 70%. Wall motion was normal; there were no regional wall motion abnormalities. - Aortic valve: Mildly calcified annulus. Normal thickness leaflets. - Right ventricle: The cavity size was normal. Wall thickness was mildly increased. Systolic function was hyperdynamic. - Atrial septum: No defect or patent foramen ovale was identified.    Current Medications    . enoxaparin  40 mg Subcutaneous Q24H  . estradiol-norethindrone  1 patch Transdermal 2 times weekly  . levothyroxine  50 mcg Oral Daily  . moxifloxacin  400 mg Intravenous Q24H  . oseltamivir  75 mg Oral BID  . potassium chloride  40 mEq  Oral Daily  . potassium chloride  60 mEq Oral Once  . DISCONTD: potassium chloride      . DISCONTD: potassium chloride  40 mEq Oral Once  . DISCONTD: potassium chloride SA  60 mEq Oral Once     Assessment:  1. Episode of atrial flutter/fibrillation, spontaneously converted to sinus rhythm after initially receiving adenosine and then Cardizem. This is noted in the setting of influenza and electrolyte abnormalities. During monitoring, rhythm is been stable. Echocardiogram shows normal LVEF with no major valvular abnormalities. Troponin I levels normal. TSH normal.  2. Influenza.  3. Severe hypokalemia, improving.  4. Syncope in the setting of above.  Plan:  Dr. Juanetta Gosling is repleting potassium. Symptomatically, patient is feeling better and is likely to go home today. She is also on Tamiflu. Plan to initiate Toprol-XL 25 mg daily, and she can followup in our clinic in approximately 2 weeks. Visit scheduled for Thursday December 20 at 1:20 PM with Ms. Lawrence. Please ensure that she has prescription for Toprol-XL, and is aware of her visit date at discharge.  Jonelle Sidle, M.D., F.A.C.C.

## 2011-08-23 NOTE — Progress Notes (Signed)
Pt to be d/c home per MD order. All medications and discharge instructions gone over with patient and patients family member. All questions and concerns answered. Pt discharged via wheelchair with nursing assistant.

## 2011-08-23 NOTE — Discharge Summary (Signed)
Physician Discharge Summary  Patient ID: Allison Koch MRN: [id number] DOB/AGE: 05-09-1956 55 y.o. Primary Care Physician:Kenderick Kobler L, MD Admit date: 08/22/2011 Discharge date: 08/23/2011    Discharge Diagnoses:   Principal Problem:  *Supraventricular tachycardia Active Problems:  Syncope  Influenza  Hypokalemia  Hypothyroidism   Current Discharge Medication List    START taking these medications   Details  moxifloxacin (AVELOX) 400 MG tablet Take 1 tablet (400 mg total) by mouth daily. Qty: 7 tablet, Refills: 0    oseltamivir (TAMIFLU) 75 MG capsule Take 1 capsule (75 mg total) by mouth 2 (two) times daily. Qty: 20 capsule, Refills: 1    POT BICARB-POT CHLORIDE,25MEQ, (EFFERVESCENT POT CHLORIDE, 25,) 25 MEQ TBEF Take 1 tablet (25 mEq total) by mouth daily. Qty: 30 tablet, Refills: 0      CONTINUE these medications which have NOT CHANGED   Details  Calcium Carbonate-Vitamin D 600-400 MG-UNIT per chew tablet Chew 1 tablet by mouth 2 (two) times daily.      estradiol-norethindrone (COMBIPATCH) 0.05-0.14 MG/DAY Place 1 patch onto the skin 2 (two) times a week. saturdays     ibuprofen (ADVIL,MOTRIN) 200 MG tablet Take 600 mg by mouth every 8 (eight) hours as needed. inflammation     levothyroxine (SYNTHROID, LEVOTHROID) 50 MCG tablet Take 50 mcg by mouth daily.      albuterol (PROVENTIL,VENTOLIN) 90 MCG/ACT inhaler Inhale 1-2 puffs into the lungs every 6 (six) hours as needed for wheezing (Cough). Qty: 17 g, Refills: 1        Discharged Condition: Improved    Consults: Cardiology  Significant Diagnostic Studies: Dg Wrist Complete Left  08/17/11  *RADIOLOGY REPORT*  Clinical Data: 55 year old female with pain and swelling.  LEFT WRIST - COMPLETE 3+ VIEW  Comparison: None.  Findings: Distal left radius and ulna are intact.  There is radiocarpal joint space narrowing with subchondral sclerosis. There is mild intracarpal joint space narrowing with subchondral  sclerosis.  Carpometacarpal joint spaces appear preserved.  Carpal bone alignment is within normal limits.  The scaphoid appears intact.  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  Original Report Authenticated By: Harley Hallmark, M.D.   Dg Chest Port 1 View  08/22/2011  *RADIOLOGY REPORT*  Clinical Data: Supraventricular tachycardia.  Chest pain.  PORTABLE CHEST - 1 VIEW  Comparison: 06/25/2011  Findings: Midline trachea.  Normal heart size and mediastinal contours for age.  No pleural effusion or pneumothorax.  COPD with biapical pleural parenchymal scarring.  This is unchanged.  No superimposed airspace disease. No congestive failure.  IMPRESSION: COPD with biapical pleural parenchymal scarring. No acute findings.  Original Report Authenticated By: Consuello Bossier, M.D.    Lab Results: Basic Metabolic Panel:  Basename 08/23/11 0354 08/22/11 0828  NA 138 139  K 3.0* 2.9*  CL 107 102  CO2 23 28  GLUCOSE 106* 135*  BUN 4* 8  CREATININE 0.75 0.81  CALCIUM 8.0* 9.1  MG -- --  PHOS -- --   Liver Function Tests:  [facility] 08/22/11 0828  AST 26  ALT 9  ALKPHOS 54  BILITOT 0.2*  PROT 6.5  ALBUMIN 3.3*     CBC:  Basename 08/22/11 0828  WBC 10.5  NEUTROABS 9.2*  HGB 13.1  HCT 38.6  MCV 89.8  PLT 203    Recent Results (from the past 240 hour(s))  MRSA PCR SCREENING     Status: Normal   Collection Time  08/22/11 11:20 AM      Component Value Range Status Comment   MRSA by PCR NEGATIVE  NEGATIVE  Final      Hospital Course: She came to the emergency room because she had a syncopal episode at home. Her husband heard her cry out and came down 2 stairs and found her collapsed to the floor. He was not sure she was breathing or had a pulse so he started CPR. He called for EMS help and when EMS arrived she was found to be in a supraventricular tachycardia with a heart rate in the 150 range. This was treated  with Cardizem and resolved. She had been having cough and congestion and temperature as high as 102 and tested positive for influenza. She was monitored in the step down unit had no further episodes of supraventricular tachycardia. She was treated for influenza and also for bronchitis. She was markedly improved at the time of discharge. She was found to be hypokalemic and potassium was replaced and she'll be on potassium replacement at home.  Discharge Exam: Blood pressure 133/59, pulse 90, temperature 99 F (37.2 C), temperature source Oral, resp. rate 21, height 5\' 5"  (1.651 m), weight 51.7 kg (113 lb 15.7 oz), SpO2 98.00%. She is awake and alert looks comfortable and her chest is much clearer  Disposition: Home. She will need to have a thyroid ultrasound done as an outpatient  Discharge Orders    Future Orders Please Complete By Expires   Discharge patient         Follow-up Information    Follow up with Kandis Henry L .         Signed: Mantaj Chamberlin L 08/23/2011, 8:18 AM

## 2011-08-27 LAB — CULTURE, BLOOD (ROUTINE X 2)
Culture: NO GROWTH
Culture: NO GROWTH

## 2011-09-06 ENCOUNTER — Encounter: Payer: Self-pay | Admitting: Adult Health

## 2011-09-06 ENCOUNTER — Ambulatory Visit (INDEPENDENT_AMBULATORY_CARE_PROVIDER_SITE_OTHER): Payer: 59 | Admitting: Adult Health

## 2011-09-06 VITALS — BP 144/88 | HR 80 | Resp 16 | Ht 65.0 in | Wt 110.0 lb

## 2011-09-06 DIAGNOSIS — I498 Other specified cardiac arrhythmias: Secondary | ICD-10-CM

## 2011-09-06 DIAGNOSIS — I471 Supraventricular tachycardia: Secondary | ICD-10-CM

## 2011-09-06 MED ORDER — METOPROLOL SUCCINATE ER 25 MG PO TB24: 25.0000 mg | ORAL_TABLET | Freq: Every day | ORAL | Status: DC

## 2011-09-06 NOTE — Patient Instructions (Signed)
Your physician recommends that you continue on your current medications as directed. Please refer to the Current Medication list given to you today.  Your physician recommends that you schedule a follow-up appointment in: 6 months  

## 2011-09-06 NOTE — Progress Notes (Signed)
   HPI: Allison Koch is a 55 y/o patient of Dr Diona Browner we are seeing on follow up after being seen on consultation for PAF and SVT. She initially was found to be in atrial fib/flutter and converted to NSR after receiving adenosine and Cardizem. This occurred in the setting of influenza and electrolyte imbalance-potassium of 3.0, with dehydration. She was also found to be hypothyroid and placed on synthroid 50 mcg.  Echocardiogram completed during that admission demonstrated EF of 65%-70% with no WMA.  She was placed on metoprolol XL 25 mg and hydrated and given potassium. Since discharge she has been feeling well without recurrence of racing or palpitations.  She has been medically complaint.  Allergies  Allergen Reactions  . Other     Mycins per patient.  . Penicillins     Current Outpatient Prescriptions  Medication Sig Dispense Refill  . Calcium Carbonate-Vitamin D 600-400 MG-UNIT per chew tablet Chew 1 tablet by mouth 2 (two) times daily.        Marland Kitchen estradiol-norethindrone (COMBIPATCH) 0.05-0.14 MG/DAY Place 1 patch onto the skin 2 (two) times a week. saturdays       . ibuprofen (ADVIL,MOTRIN) 200 MG tablet Take 600 mg by mouth every 8 (eight) hours as needed. inflammation       . levothyroxine (SYNTHROID, LEVOTHROID) 50 MCG tablet Take 50 mcg by mouth daily.        . metoprolol succinate (TOPROL-XL) 25 MG 24 hr tablet Take 1 tablet (25 mg total) by mouth daily.  30 tablet  6  . POT BICARB-POT CHLORIDE,25MEQ, (EFFERVESCENT POT CHLORIDE, 25,) 25 MEQ TBEF Take 1 tablet (25 mEq total) by mouth daily.  30 tablet  0    Past Medical History  Diagnosis Date  . Thyroid disease   . PAF (paroxysmal atrial fibrillation)   . SVT (supraventricular tachycardia)     Past Surgical History  Procedure Date  . Tonsillectomy     ZOX:[id number] of systems complete and found to be negative unless listed above PHYSICAL EXAM BP 144/88  Pulse 80  Resp 16  Ht 5\' 5"  (1.651 m)  Wt 110 lb (49.896 kg)  BMI  18.31 kg/m2  General: Well developed, well nourished, in no acute distress Head: Eyes PERRLA, No xanthomas.   Normal cephalic and atramatic  Lungs: Clear bilaterally to auscultation and percussion. Heart: HRRR S1 S2, soft systolic murmur at the apex..  Pulses are 2+ & equal.            No carotid bruit. No JVD.  No abdominal bruits. No femoral bruits. Abdomen: Bowel sounds are positive, abdomen soft and non-tender without masses or                  Hernia's noted. Msk:  Back normal, normal gait. Normal strength and tone for age. Extremities: No clubbing, cyanosis or edema.  DP +1 Neuro: Alert and oriented X 3. Psych:  Good affect, responds appropriately    ASSESSMENT AND PLAN

## 2011-09-06 NOTE — Assessment & Plan Note (Signed)
Notes state that she had PAF in the setting of dehydration, hypokalemia, and newly diagnosed hyperthyroidism. She is now on BB and synthroid without further episodes or complaints.  Will continue with this for now.  She is advised that if she has more episodes of racing or irregular heart rhythm, she can take an addition 1/2 tablet of metoprolol and call us. She has CHADs score of 1.

## 2012-03-14 ENCOUNTER — Encounter: Payer: Self-pay | Admitting: Cardiology

## 2012-03-18 ENCOUNTER — Ambulatory Visit (INDEPENDENT_AMBULATORY_CARE_PROVIDER_SITE_OTHER): Payer: 59 | Admitting: Cardiology

## 2012-03-18 ENCOUNTER — Encounter: Payer: Self-pay | Admitting: Cardiology

## 2012-03-18 VITALS — BP 102/58 | HR 73 | Ht 65.0 in | Wt 118.0 lb

## 2012-03-18 DIAGNOSIS — E039 Hypothyroidism, unspecified: Secondary | ICD-10-CM

## 2012-03-18 DIAGNOSIS — I4891 Unspecified atrial fibrillation: Secondary | ICD-10-CM

## 2012-03-18 NOTE — Progress Notes (Signed)
   Clinical Summary Allison Koch is a 56 y.o.female presenting for followup. She was seen by Ms. Lawrence in December 2012 after consultation with Dr. Dietrich Pates for paroxysmal atrial fibrillation/flutter in the setting of influenza. She has been on low-dose Toprol-XL since that time, tolerating it well, states that she has had no significant palpitations, also improved sleep in general. She reports no functional limitations.   Allergies  Allergen Reactions  . Other     Mycins per patient.  . Penicillins     Current Outpatient Prescriptions  Medication Sig Dispense Refill  . Calcium Carbonate-Vitamin D 600-400 MG-UNIT per chew tablet Chew 1 tablet by mouth 2 (two) times daily.        Marland Kitchen estradiol-norethindrone (COMBIPATCH) 0.05-0.14 MG/DAY Place 1 patch onto the skin 2 (two) times a week. saturdays       . ibuprofen (ADVIL,MOTRIN) 200 MG tablet Take 600 mg by mouth every 8 (eight) hours as needed. inflammation       . levothyroxine (SYNTHROID, LEVOTHROID) 50 MCG tablet Take 50 mcg by mouth daily.        . metoprolol succinate (TOPROL-XL) 25 MG 24 hr tablet Take 1 tablet (25 mg total) by mouth daily.  30 tablet  6  . POT BICARB-POT CHLORIDE,25MEQ, (EFFERVESCENT POT CHLORIDE, 25,) 25 MEQ TBEF Take 1 tablet (25 mEq total) by mouth daily.  30 tablet  0    Past Medical History  Diagnosis Date  . Hypothyroidism   . PAF (paroxysmal atrial fibrillation)     Noted in setting of acute illness    Social History Ms. Skousen reports that she has never smoked. She does not have any smokeless tobacco history on file. Ms. Crumpler reports that she does not drink alcohol.  Review of Systems Negative except as outlined.  Physical Examination Filed Vitals:   03/18/12 1305  BP: 102/58  Pulse: 73   Thin woman in no acute distress. HEENT: Conjunctiva and lids normal, oropharynx clear. Neck: Supple, no elevated JVP or carotid bruits, no thyromegaly. Lungs: Clear to auscultation, nonlabored breathing at  rest. Cardiac: Regular rate and rhythm, no S3 or significant systolic murmur, no pericardial rub. Abdomen: Soft, nontender, bowel sounds present, no guarding or rebound. Extremities: No pitting edema, distal pulses 2+.    Problem List and Plan   Atrial fibrillation Paroxysmal, previously described palpitations in the setting of pain, and documented episode in the setting of influenza. She has a low thromboembolic risk score, does not require anticoagulation. We continue observation on beta blocker. She has had no palpitations or syncope.  Hypothyroidism Keep followup with Dr. Juanetta Gosling.    Jonelle Sidle, M.D., F.A.C.C.

## 2012-03-18 NOTE — Assessment & Plan Note (Signed)
Paroxysmal, previously described palpitations in the setting of pain, and documented episode in the setting of influenza. She has a low thromboembolic risk score, does not require anticoagulation. We continue observation on beta blocker. She has had no palpitations or syncope.

## 2012-03-18 NOTE — Patient Instructions (Addendum)
**Note De-identified  Obfuscation** Your physician recommends that you continue on your current medications as directed. Please refer to the Current Medication list given to you today.  Your physician recommends that you schedule a follow-up appointment in: 9 months  

## 2012-03-18 NOTE — Assessment & Plan Note (Signed)
Keep followup with Dr. Hawkins. 

## 2012-11-25 ENCOUNTER — Encounter (INDEPENDENT_AMBULATORY_CARE_PROVIDER_SITE_OTHER): Payer: 59 | Admitting: Cardiology

## 2012-11-25 ENCOUNTER — Encounter: Payer: Self-pay | Admitting: Cardiology

## 2012-11-25 DIAGNOSIS — I4891 Unspecified atrial fibrillation: Secondary | ICD-10-CM

## 2012-11-25 NOTE — Progress Notes (Signed)
Canceled visit.  This encounter was created in error - please disregard. 

## 2012-11-25 NOTE — Patient Instructions (Signed)
Your physician recommends that you schedule a follow-up appointment in:  

## 2013-01-07 ENCOUNTER — Ambulatory Visit (INDEPENDENT_AMBULATORY_CARE_PROVIDER_SITE_OTHER): Payer: 59 | Admitting: Cardiology

## 2013-01-07 ENCOUNTER — Encounter: Payer: Self-pay | Admitting: Cardiology

## 2013-01-07 VITALS — BP 128/71 | HR 72 | Ht 65.0 in | Wt 119.1 lb

## 2013-01-07 DIAGNOSIS — I4891 Unspecified atrial fibrillation: Secondary | ICD-10-CM

## 2013-01-07 MED ORDER — METOPROLOL SUCCINATE ER 25 MG PO TB24: 12.5000 mg | ORAL_TABLET | Freq: Every day | ORAL | Status: DC

## 2013-01-07 NOTE — Assessment & Plan Note (Signed)
Paroxysmal, no obvious recurrences. Reduce Toprol-XL to 12.5 mg daily. Annual followup arranged.

## 2013-01-07 NOTE — Patient Instructions (Signed)
   Decrease Toprol XL 25mg  to 1/2 tab (12.5mg ) daily - new sent to pharm Continue all other current medications. Your physician wants you to follow up in:  1 year.  You will receive a reminder letter in the mail one-two months in advance.  If you don't receive a letter, please call our office to schedule the follow up appointment

## 2013-01-07 NOTE — Progress Notes (Signed)
   Clinical Summary Ms. Pember is a 57 y.o.female last seen in July 2013. She reports no episodes of palpitations, no clearly documented atrial fibrillation since last visit.  No specific complaints other than recent sinus pain and headaches during the pollen season. She took a recent course of azithromycin.  She continues on low-dose beta blocker, is interested in cutting the dose in half.  ECG today shows sinus rhythm with nonspecific ST changes, small R prime in leads V1 and V2.   Allergies  Allergen Reactions  . Other     Mycins per patient.  . Penicillins     Current Outpatient Prescriptions  Medication Sig Dispense Refill  . Calcium Carbonate-Vitamin D 600-400 MG-UNIT per chew tablet Chew 1 tablet by mouth 2 (two) times daily.        Marland Kitchen estradiol-norethindrone (COMBIPATCH) 0.05-0.14 MG/DAY Place 1 patch onto the skin 2 (two) times a week. saturdays       . ibuprofen (ADVIL,MOTRIN) 200 MG tablet Take 600 mg by mouth every 8 (eight) hours as needed. inflammation       . levothyroxine (SYNTHROID, LEVOTHROID) 50 MCG tablet Take 50 mcg by mouth daily.        . metoprolol succinate (TOPROL-XL) 25 MG 24 hr tablet Take 0.5 tablets (12.5 mg total) by mouth daily.  15 tablet  6   No current facility-administered medications for this visit.    Past Medical History  Diagnosis Date  . Hypothyroidism   . PAF (paroxysmal atrial fibrillation)     Noted in setting of acute illness    Social History Ms. Carnero reports that she has never smoked. She does not have any smokeless tobacco history on file. Ms. Mundo reports that she does not drink alcohol.  Review of Systems Negative except as outlined.  Physical Examination Filed Vitals:   01/07/13 1523  BP: 128/71  Pulse: 72   Filed Weights   01/07/13 1523  Weight: 119 lb 1.9 oz (54.032 kg)    Thin woman in no acute distress.  HEENT: Conjunctiva and lids normal, oropharynx clear.  Neck: Supple, no elevated JVP or carotid  bruits, no thyromegaly.  Lungs: Clear to auscultation, nonlabored breathing at rest.  Cardiac: Regular rate and rhythm, no S3 or significant systolic murmur, no pericardial rub.  Abdomen: Soft, nontender, bowel sounds present, no guarding or rebound.  Extremities: No pitting edema, distal pulses 2+.   Problem List and Plan   Atrial fibrillation Paroxysmal, no obvious recurrences. Reduce Toprol-XL to 12.5 mg daily. Annual followup arranged.    Jonelle Sidle, M.D., F.A.C.C.

## 2013-07-20 ENCOUNTER — Other Ambulatory Visit: Payer: Self-pay | Admitting: *Deleted

## 2013-07-20 MED ORDER — METOPROLOL SUCCINATE ER 25 MG PO TB24: 12.5000 mg | ORAL_TABLET | Freq: Every day | ORAL | Status: DC

## 2014-06-12 ENCOUNTER — Other Ambulatory Visit: Payer: Self-pay | Admitting: Cardiology

## 2014-06-22 ENCOUNTER — Other Ambulatory Visit: Payer: Self-pay | Admitting: Cardiology

## 2014-06-28 ENCOUNTER — Encounter: Payer: Self-pay | Admitting: *Deleted

## 2014-07-14 ENCOUNTER — Other Ambulatory Visit: Payer: Self-pay | Admitting: Cardiology

## 2014-08-06 ENCOUNTER — Encounter: Payer: Self-pay | Admitting: Cardiology

## 2014-08-06 ENCOUNTER — Ambulatory Visit (INDEPENDENT_AMBULATORY_CARE_PROVIDER_SITE_OTHER): Payer: BC Managed Care – PPO | Admitting: Cardiology

## 2014-08-06 VITALS — BP 164/70 | HR 71 | Ht 65.0 in | Wt 111.0 lb

## 2014-08-06 DIAGNOSIS — IMO0001 Reserved for inherently not codable concepts without codable children: Secondary | ICD-10-CM | POA: Insufficient documentation

## 2014-08-06 DIAGNOSIS — I480 Paroxysmal atrial fibrillation: Secondary | ICD-10-CM

## 2014-08-06 DIAGNOSIS — R030 Elevated blood-pressure reading, without diagnosis of hypertension: Secondary | ICD-10-CM

## 2014-08-06 NOTE — Assessment & Plan Note (Signed)
Quiescent. No obvious recurrences. Continue observation.

## 2014-08-06 NOTE — Progress Notes (Signed)
   Reason for visit: History of PAF  Clinical Summary Ms. Hatcher is a 58 y.o.female last seen in April 2014. She presents for a routine visit. Reports no palpitations, has remained on low-dose beta blocker, although we did discuss going to an as needed dose if she would like. She reports no exertional chest pain, has NYHA class I dyspnea.  ECG today shows sinus rhythm with incomplete right bundle branch block, nonspecific ST changes.   Allergies  Allergen Reactions  . Other     Mycins per patient.  . Penicillins     Current Outpatient Prescriptions  Medication Sig Dispense Refill  . Calcium Carbonate-Vitamin D 600-400 MG-UNIT per chew tablet Chew 1 tablet by mouth 2 (two) times daily.      Marland Kitchen estradiol-norethindrone (COMBIPATCH) 0.05-0.14 MG/DAY Place 1 patch onto the skin 2 (two) times a week. saturdays     . ibuprofen (ADVIL,MOTRIN) 200 MG tablet Take 600 mg by mouth every 8 (eight) hours as needed. inflammation     . levothyroxine (SYNTHROID, LEVOTHROID) 50 MCG tablet Take 50 mcg by mouth daily.      . metoprolol succinate (TOPROL-XL) 25 MG 24 hr tablet TAKE ONE-HALF TABLET BY MOUTH ONCE A DAY 15 tablet 0   No current facility-administered medications for this visit.    Past Medical History  Diagnosis Date  . Hypothyroidism   . PAF (paroxysmal atrial fibrillation)     Noted in setting of acute illness    Social History Ms. Ethington reports that she has never smoked. She does not have any smokeless tobacco history on file. Ms. Hodapp reports that she does not drink alcohol.  Review of Systems Complete review of systems negative except as otherwise outlined in the clinical summary and also the following.  Physical Examination Filed Vitals:   08/06/14 1614  BP: 164/70  Pulse: 71   Filed Weights   08/06/14 1614  Weight: 111 lb (50.349 kg)    Thin woman in no acute distress.  HEENT: Conjunctiva and lids normal, oropharynx clear.  Neck: Supple, no elevated JVP or  carotid bruits, no thyromegaly.  Lungs: Clear to auscultation, nonlabored breathing at rest.  Cardiac: Regular rate and rhythm, no S3 or significant systolic murmur, no pericardial rub.  Abdomen: Soft, nontender, bowel sounds present, no guarding or rebound.  Extremities: No pitting edema, distal pulses 2+.   Problem List and Plan   Paroxysmal atrial fibrillation Quiescent. No obvious recurrences. Continue observation.  Elevated blood pressure Noted today. Patient under stress, states that husband had recent diagnosis of prostate cancer. I asked her to keep an eye on blood pressure with Dr. Luan Pulling.    Satira Sark, M.D., F.A.C.C.

## 2014-08-06 NOTE — Patient Instructions (Signed)
Your physician wants you to follow-up in: 1 year You will receive a reminder letter in the mail two months in advance. If you don't receive a letter, please call our office to schedule the follow-up appointment.    Your physician recommends that you continue on your current medications as directed. Please refer to the Current Medication list given to you today.     Thank you for choosing [facility] !  

## 2014-08-06 NOTE — Assessment & Plan Note (Signed)
Noted today. Patient under stress, states that husband had recent diagnosis of prostate cancer. I asked her to keep an eye on blood pressure with Dr. Luan Pulling.

## 2014-08-17 ENCOUNTER — Other Ambulatory Visit: Payer: Self-pay | Admitting: Cardiology

## 2015-08-22 ENCOUNTER — Other Ambulatory Visit: Payer: Self-pay | Admitting: Cardiology

## 2015-09-26 ENCOUNTER — Other Ambulatory Visit: Payer: Self-pay | Admitting: Cardiology

## 2015-10-07 ENCOUNTER — Encounter: Payer: Self-pay | Admitting: Cardiology

## 2015-10-07 ENCOUNTER — Ambulatory Visit (INDEPENDENT_AMBULATORY_CARE_PROVIDER_SITE_OTHER): Payer: BLUE CROSS/BLUE SHIELD | Admitting: Cardiology

## 2015-10-07 VITALS — BP 108/70 | HR 73 | Ht 65.0 in | Wt 110.0 lb

## 2015-10-07 DIAGNOSIS — I480 Paroxysmal atrial fibrillation: Secondary | ICD-10-CM | POA: Diagnosis not present

## 2015-10-07 DIAGNOSIS — E039 Hypothyroidism, unspecified: Secondary | ICD-10-CM | POA: Diagnosis not present

## 2015-10-07 MED ORDER — METOPROLOL SUCCINATE ER 25 MG PO TB24: ORAL_TABLET | ORAL | Status: DC

## 2015-10-07 MED ORDER — METOPROLOL SUCCINATE ER 25 MG PO TB24: 12.5000 mg | ORAL_TABLET | Freq: Every day | ORAL | Status: DC | PRN

## 2015-10-07 NOTE — Progress Notes (Signed)
Cardiology Office Note  Date: 10/07/2015   ID: Allison, Koch 1955/11/05, MRN [id number]  PCP: Alonza Bogus, MD  Primary Cardiologist: Rozann Lesches, MD   Chief Complaint  Patient presents with  . PAF    History of Present Illness: Allison Koch is a 60 y.o. female last seen in November 2015. She presents for a routine follow-up visit. Over the last year she has not felt any palpitations whatsoever. She reports no change in stamina, no exertional chest pain or unusual shortness of breath.  CHADSVASC score is 1. He has a remote history of atrial fibrillation in the setting of acute illness, no obvious recurrences.  We reviewed her medications. She stated that she would like to try and stop Toprol-XL altogether and I concurred. She could use this as needed.  Past Medical History  Diagnosis Date  . Hypothyroidism   . PAF (paroxysmal atrial fibrillation) ([place])     Noted in setting of acute illness    Current Outpatient Prescriptions  Medication Sig Dispense Refill  . Calcium Carbonate-Vitamin D 600-400 MG-UNIT per chew tablet Chew 1 tablet by mouth 2 (two) times daily.      Marland Kitchen estradiol (CLIMARA - DOSED IN MG/24 HR) 0.05 mg/24hr patch     . ibuprofen (ADVIL,MOTRIN) 200 MG tablet Take 600 mg by mouth every 8 (eight) hours as needed. inflammation     . levothyroxine (SYNTHROID, LEVOTHROID) 50 MCG tablet Take 50 mcg by mouth daily.      . progesterone (PROMETRIUM) 100 MG capsule     . metoprolol succinate (TOPROL XL) 25 MG 24 hr tablet Take 0.5 tablets (12.5 mg total) by mouth daily as needed. Take as needed daily for palpitations 30 tablet 6   No current facility-administered medications for this visit.   Allergies:  Other and Penicillins   Social History: The patient  reports that she has never smoked. She does not have any smokeless tobacco history on file. She reports that she does not drink alcohol or use illicit drugs.   ROS:  Please see the history of present  illness. Otherwise, complete review of systems is positive for none.  All other systems are reviewed and negative.   Physical Exam: VS:  BP 108/70 mmHg  Pulse 73  Ht 5\' 5"  (1.651 m)  Wt 110 lb (49.896 kg)  BMI 18.31 kg/m2  SpO2 99%, BMI Body mass index is 18.31 kg/(m^2).  Wt Readings from Last 3 Encounters:  10/07/15 110 lb (49.896 kg)  08/06/14 111 lb (50.349 kg)  01/07/13 119 lb 1.9 oz (54.032 kg)    Thin woman in no acute distress.  HEENT: Conjunctiva and lids normal, oropharynx clear.  Neck: Supple, no elevated JVP or carotid bruits, no thyromegaly.  Lungs: Clear to auscultation, nonlabored breathing at rest.  Cardiac: Regular rate and rhythm, no S3 or significant systolic murmur, no pericardial rub.  Abdomen: Soft, nontender, bowel sounds present, no guarding or rebound.  Extremities: No pitting edema, distal pulses 2+.  ECG: ECG is ordered today and shows sinus rhythm with low voltage, R' in lead V1 and V2.  Other Studies Reviewed Today:  Echocardiogram 08/22/2011: Study Conclusions  - Left ventricle: The cavity size was normal. Borderline concentric hypertrophy. Systolic function was vigorous. The estimated ejection fraction was in the range of 65% to 70%. Wall motion was normal; there were no regional wall motion abnormalities. - Aortic valve: Mildly calcified annulus. Normal thickness leaflets. - Right ventricle: The cavity size was normal.  Wall thickness was mildly increased. Systolic function was hyperdynamic. - Atrial septum: No defect or patent foramen ovale was identified.  Assessment and Plan:  1. Remote history of atrial fibrillation in the setting of acute illness. There have been no obvious recurrences. She has not experienced any palpitations in the last year. Plan will be to stop Toprol-XL and observe. She could use this as needed.  2. Hypothyroidism, on Synthroid. Keep follow-up with Dr. Luan Pulling.  Current medicines were  reviewed with the patient today.   Orders Placed This Encounter  Procedures  . EKG 12-Lead    Disposition: FU with me in 1 year.   Signed, Satira Sark, MD, [facility] 10/07/2015 2:28 PM    [place] [facility] [address], [place], [place] [address] Phone: [phone]; Fax: [phone]

## 2015-10-07 NOTE — Patient Instructions (Signed)
Your physician wants you to follow-up in: 1 year with Dr Ferne Reus will receive a reminder letter in the mail two months in advance. If you don't receive a letter, please call our office to schedule the follow-up appointment.    Take Toprol only as needed now for palpitations    If you need a refill on your cardiac medications before your next appointment, please call your pharmacy.   Thank you for choosing [facility] !

## 2016-02-14 DIAGNOSIS — Z01419 Encounter for gynecological examination (general) (routine) without abnormal findings: Secondary | ICD-10-CM | POA: Diagnosis not present

## 2016-02-14 DIAGNOSIS — Z681 Body mass index (BMI) 19 or less, adult: Secondary | ICD-10-CM | POA: Diagnosis not present

## 2016-02-14 DIAGNOSIS — Z1231 Encounter for screening mammogram for malignant neoplasm of breast: Secondary | ICD-10-CM | POA: Diagnosis not present

## 2016-02-14 DIAGNOSIS — Z13820 Encounter for screening for osteoporosis: Secondary | ICD-10-CM | POA: Diagnosis not present

## 2016-02-14 LAB — HM DEXA SCAN

## 2016-06-05 DIAGNOSIS — Z1329 Encounter for screening for other suspected endocrine disorder: Secondary | ICD-10-CM | POA: Diagnosis not present

## 2016-06-05 DIAGNOSIS — Z13228 Encounter for screening for other metabolic disorders: Secondary | ICD-10-CM | POA: Diagnosis not present

## 2016-06-05 DIAGNOSIS — Z13220 Encounter for screening for lipoid disorders: Secondary | ICD-10-CM | POA: Diagnosis not present

## 2016-07-09 ENCOUNTER — Other Ambulatory Visit: Payer: Self-pay | Admitting: Pulmonary Disease

## 2016-07-09 ENCOUNTER — Other Ambulatory Visit (HOSPITAL_COMMUNITY): Payer: Self-pay | Admitting: Pulmonary Disease

## 2016-07-09 DIAGNOSIS — R945 Abnormal results of liver function studies: Secondary | ICD-10-CM

## 2016-07-27 ENCOUNTER — Ambulatory Visit (HOSPITAL_COMMUNITY)
Admission: RE | Admit: 2016-07-27 | Discharge: 2016-07-27 | Disposition: A | Discharge status: Home / Self Care | Payer: BLUE CROSS/BLUE SHIELD | Source: Ambulatory Visit | Attending: Pulmonary Disease | Admitting: Pulmonary Disease

## 2016-07-27 DIAGNOSIS — R945 Abnormal results of liver function studies: Secondary | ICD-10-CM

## 2016-07-27 DIAGNOSIS — R7989 Other specified abnormal findings of blood chemistry: Secondary | ICD-10-CM | POA: Insufficient documentation

## 2016-07-27 DIAGNOSIS — K828 Other specified diseases of gallbladder: Secondary | ICD-10-CM | POA: Diagnosis not present

## 2016-10-16 NOTE — Progress Notes (Signed)
Cardiology Office Note  Date: 10/17/2016   ID: Allison, Koch July 09, 1956, MRN [id number]  PCP: Alonza Bogus, MD  Primary Cardiologist: Rozann Lesches, MD   Chief Complaint  Patient presents with  . History of atrial fibrillation    History of Present Illness: Allison Koch is a 61 y.o. female last seen in January 2017. She presents for a routine follow-up visit. In the last year she does not report any significant palpitations and has not had to use any Toprol-XL she continues to work full time. Reports no significant limitations.  I reviewed her ECG today which shows sinus rhythm with R' in lead V1 and V2, nonspecific ST changes.  Past Medical History:  Diagnosis Date  . Hypothyroidism   . PAF (paroxysmal atrial fibrillation) ([place])    Noted in setting of acute illness    Current Outpatient Prescriptions  Medication Sig Dispense Refill  . Calcium Carbonate-Vitamin D 600-400 MG-UNIT per chew tablet Chew 1 tablet by mouth 2 (two) times daily.      Marland Kitchen estradiol (CLIMARA - DOSED IN MG/24 HR) 0.05 mg/24hr patch     . ibuprofen (ADVIL,MOTRIN) 200 MG tablet Take 600 mg by mouth every 8 (eight) hours as needed. inflammation     . levothyroxine (SYNTHROID, LEVOTHROID) 50 MCG tablet Take 50 mcg by mouth daily.      . progesterone (PROMETRIUM) 100 MG capsule      No current facility-administered medications for this visit.    Allergies:  Other and Penicillins   Social History: The patient  reports that she has never smoked. She has quit using smokeless tobacco. She reports that she does not drink alcohol or use drugs.   ROS:  Please see the history of present illness. Otherwise, complete review of systems is positive for none.  All other systems are reviewed and negative.   Physical Exam: VS:  BP (!) 142/76   Pulse 88   Ht 5\' 5"  (1.651 m)   Wt 116 lb (52.6 kg)   SpO2 98%   BMI 19.30 kg/m , BMI Body mass index is 19.3 kg/m.  Wt Readings from Last 3 Encounters:    10/17/16 116 lb (52.6 kg)  10/07/15 110 lb (49.9 kg)  08/06/14 111 lb (50.3 kg)    Thin woman in no acute distress.  HEENT: Conjunctiva and lids normal, oropharynx clear.  Neck: Supple, no elevated JVP or carotid bruits, no thyromegaly.  Lungs: Clear to auscultation, nonlabored breathing at rest.  Cardiac: Regular rate and rhythm, no S3 or significant systolic murmur, no pericardial rub.  Abdomen: Soft, nontender, bowel sounds present, no guarding or rebound.  Extremities: No pitting edema, distal pulses 2+.  ECG: I personally reviewed the tracing from 10/07/2015 which showed sinus rhythm with low voltage, decreased R wave progression and R' in lead V1 and V2.  Other Studies Reviewed Today:  Echocardiogram 08/22/2011: Study Conclusions  - Left ventricle: The cavity size was normal. Borderline concentric hypertrophy. Systolic function was vigorous. The estimated ejection fraction was in the range of 65% to 70%. Wall motion was normal; there were no regional wall motion abnormalities. - Aortic valve: Mildly calcified annulus. Normal thickness leaflets. - Right ventricle: The cavity size was normal. Wall thickness was mildly increased. Systolic function was hyperdynamic. - Atrial septum: No defect or patent foramen ovale was identified.  Assessment and Plan:  Prior history of atrial fibrillation in the setting of acute illness. She has had no significant recurrence is in quite  some time and we have taken her off beta blocker altogether. She still has Toprol-XL to use with palpitations, experienced none in the last year. At this point we will continue with observation only. CHADSVASC score is 1.  Current medicines were reviewed with the patient today.   Orders Placed This Encounter  Procedures  . EKG 12-Lead    Disposition: Follow-up in one year.  Signed, Satira Sark, MD, [facility] 10/17/2016 3:30 PM    [place] at [place]. [address] [place], [place] [address] Phone: [phone]; Fax: [phone]

## 2016-10-17 ENCOUNTER — Encounter: Payer: Self-pay | Admitting: Cardiology

## 2016-10-17 ENCOUNTER — Ambulatory Visit (INDEPENDENT_AMBULATORY_CARE_PROVIDER_SITE_OTHER): Payer: BLUE CROSS/BLUE SHIELD | Admitting: Cardiology

## 2016-10-17 VITALS — BP 142/76 | HR 88 | Ht 65.0 in | Wt 116.0 lb

## 2016-10-17 DIAGNOSIS — Z8679 Personal history of other diseases of the circulatory system: Secondary | ICD-10-CM

## 2016-10-17 NOTE — Patient Instructions (Signed)

## 2017-02-26 DIAGNOSIS — Z681 Body mass index (BMI) 19 or less, adult: Secondary | ICD-10-CM | POA: Diagnosis not present

## 2017-02-26 DIAGNOSIS — Z01419 Encounter for gynecological examination (general) (routine) without abnormal findings: Secondary | ICD-10-CM | POA: Diagnosis not present

## 2017-02-26 DIAGNOSIS — E039 Hypothyroidism, unspecified: Secondary | ICD-10-CM | POA: Diagnosis not present

## 2017-02-26 DIAGNOSIS — Z1231 Encounter for screening mammogram for malignant neoplasm of breast: Secondary | ICD-10-CM | POA: Diagnosis not present

## 2017-02-26 LAB — HM PAP SMEAR: HM PAP: NEGATIVE

## 2017-03-04 LAB — HM PAP SMEAR: HM Pap smear: NEGATIVE

## 2017-03-29 DIAGNOSIS — M79641 Pain in right hand: Secondary | ICD-10-CM | POA: Diagnosis not present

## 2017-05-10 DIAGNOSIS — M25421 Effusion, right elbow: Secondary | ICD-10-CM | POA: Diagnosis not present

## 2017-05-10 DIAGNOSIS — M25521 Pain in right elbow: Secondary | ICD-10-CM | POA: Diagnosis not present

## 2017-05-10 DIAGNOSIS — R5383 Other fatigue: Secondary | ICD-10-CM | POA: Diagnosis not present

## 2017-05-27 DIAGNOSIS — M25521 Pain in right elbow: Secondary | ICD-10-CM | POA: Diagnosis not present

## 2017-05-27 DIAGNOSIS — M25531 Pain in right wrist: Secondary | ICD-10-CM | POA: Diagnosis not present

## 2017-11-11 NOTE — Progress Notes (Signed)
Cardiology Office Note  Date: 11/12/2017   ID: Allison Koch, Allison Koch 06-04-1956, MRN [id number]  PCP: Sinda Du, MD  Primary Cardiologist: Rozann Lesches, MD   Chief Complaint  Patient presents with  . History of atrial fibrillation    History of Present Illness: Allison Koch is a 62 y.o. female last seen in January 2018.  She presents for a routine follow-up visit.  She denies any palpitations or chest pain over the last year.  She has had no documented atrial fibrillation, reports no other major health changes.  I reviewed her medications which are outlined below.  I asked her to follow-up with Dr. Luan Pulling to make sure that her thyroid status is in check.  I personally reviewed her ECG today which shows normal sinus rhythm with nonspecific ST changes.  She tells me that she might be retiring this year.  Past Medical History:  Diagnosis Date  . Hypothyroidism   . PAF (paroxysmal atrial fibrillation) (HCC)    Noted in setting of acute illness    Past Surgical History:  Procedure Laterality Date  . TONSILLECTOMY      Current Outpatient Medications  Medication Sig Dispense Refill  . Calcium Carbonate-Vitamin D 600-400 MG-UNIT per chew tablet Chew 1 tablet by mouth 2 (two) times daily.      . COMBIPATCH 0.05-0.14 MG/DAY Place 1 patch onto the skin 2 (two) times a week.     Marland Kitchen ibuprofen (ADVIL,MOTRIN) 200 MG tablet Take 600 mg by mouth every 8 (eight) hours as needed. inflammation     . levothyroxine (SYNTHROID, LEVOTHROID) 50 MCG tablet Take 50 mcg by mouth daily.       No current facility-administered medications for this visit.    Allergies:  Other and Penicillins   Social History: The patient  reports that  has never smoked. She has quit using smokeless tobacco. She reports that she does not drink alcohol or use drugs.   ROS:  Please see the history of present illness. Otherwise, complete review of systems is positive for none.  All other systems are reviewed  and negative.   Physical Exam: VS:  BP 112/68 (BP Location: Left Arm)   Pulse 77   Ht 5\' 5"  (1.651 m)   Wt 119 lb (54 kg)   SpO2 98%   BMI 19.80 kg/m , BMI Body mass index is 19.8 kg/m.  Wt Readings from Last 3 Encounters:  11/12/17 119 lb (54 kg)  10/17/16 116 lb (52.6 kg)  10/07/15 110 lb (49.9 kg)    General: Thin woman, appears comfortable at rest. HEENT: Conjunctiva and lids normal, oropharynx clear. Neck: Supple, no elevated JVP or carotid bruits, no thyromegaly. Lungs: Clear to auscultation, nonlabored breathing at rest. Cardiac: Regular rate and rhythm, no S3 or significant systolic murmur, no pericardial rub. Abdomen: Soft, nontender, bowel sounds present. Extremities: No pitting edema, distal pulses 2+.  ECG: I personally reviewed the tracing from 21/2018 which showed sinus rhythm with R' in lead V1 and V2, nonspecific ST changes.  Other Studies Reviewed Today:  Echocardiogram 08/22/2011: Study Conclusions  - Left ventricle: The cavity size was normal. Borderline concentric hypertrophy. Systolic function was vigorous. The estimated ejection fraction was in the range of 65% to 70%. Wall motion was normal; there were no regional wall motion abnormalities. - Aortic valve: Mildly calcified annulus. Normal thickness leaflets. - Right ventricle: The cavity size was normal. Wall thickness was mildly increased. Systolic function was hyperdynamic. - Atrial septum: No defect  or patent foramen ovale was identified.  Assessment and Plan:  History of atrial fibrillation in the setting of acute illness several years ago.  She has had no obvious recurrences and reports no palpitations.  She has had a relatively low thromboembolic risk score and we are not pursuing anticoagulation unless she has definite recurring events.  No longer on standing beta-blocker.  Current medicines were reviewed with the patient today.   Orders Placed This Encounter    Procedures  . EKG 12-Lead    Disposition: Follow-up in 1 year.  Signed, Satira Sark, MD, [facility] 11/12/2017 11:10 AM    [place] [facility] [address] [place], [place] [address] Phone: [phone]; Fax: [phone]

## 2017-11-12 ENCOUNTER — Ambulatory Visit (INDEPENDENT_AMBULATORY_CARE_PROVIDER_SITE_OTHER): Payer: BLUE CROSS/BLUE SHIELD | Admitting: Cardiology

## 2017-11-12 ENCOUNTER — Encounter: Payer: Self-pay | Admitting: Cardiology

## 2017-11-12 VITALS — BP 112/68 | HR 77 | Ht 65.0 in | Wt 119.0 lb

## 2017-11-12 DIAGNOSIS — Z8679 Personal history of other diseases of the circulatory system: Secondary | ICD-10-CM | POA: Diagnosis not present

## 2017-11-12 NOTE — Patient Instructions (Signed)
Your physician wants you to follow-up in: 1 year with Dr.McDowell You will receive a reminder letter in the mail two months in advance. If you don't receive a letter, please call our office to schedule the follow-up appointment.    Your physician recommends that you continue on your current medications as directed. Please refer to the Current Medication list given to you today.    If you need a refill on your cardiac medications before your next appointment, please call your pharmacy.     No lab work or tests ordered today.      Thank you for choosing [facility] !        

## 2018-01-21 DIAGNOSIS — I471 Supraventricular tachycardia: Secondary | ICD-10-CM | POA: Diagnosis not present

## 2018-01-21 DIAGNOSIS — R42 Dizziness and giddiness: Secondary | ICD-10-CM | POA: Diagnosis not present

## 2018-01-21 DIAGNOSIS — E039 Hypothyroidism, unspecified: Secondary | ICD-10-CM | POA: Diagnosis not present

## 2018-01-21 DIAGNOSIS — W57XXXA Bitten or stung by nonvenomous insect and other nonvenomous arthropods, initial encounter: Secondary | ICD-10-CM | POA: Diagnosis not present

## 2018-01-22 DIAGNOSIS — E039 Hypothyroidism, unspecified: Secondary | ICD-10-CM | POA: Diagnosis not present

## 2018-01-22 DIAGNOSIS — R42 Dizziness and giddiness: Secondary | ICD-10-CM | POA: Diagnosis not present

## 2018-01-22 DIAGNOSIS — I471 Supraventricular tachycardia: Secondary | ICD-10-CM | POA: Diagnosis not present

## 2018-01-22 DIAGNOSIS — W57XXXA Bitten or stung by nonvenomous insect and other nonvenomous arthropods, initial encounter: Secondary | ICD-10-CM | POA: Diagnosis not present

## 2018-03-19 DIAGNOSIS — Z13820 Encounter for screening for osteoporosis: Secondary | ICD-10-CM | POA: Diagnosis not present

## 2018-03-19 DIAGNOSIS — Z01419 Encounter for gynecological examination (general) (routine) without abnormal findings: Secondary | ICD-10-CM | POA: Diagnosis not present

## 2018-03-19 DIAGNOSIS — Z1231 Encounter for screening mammogram for malignant neoplasm of breast: Secondary | ICD-10-CM | POA: Diagnosis not present

## 2018-03-19 DIAGNOSIS — Z681 Body mass index (BMI) 19 or less, adult: Secondary | ICD-10-CM | POA: Diagnosis not present

## 2018-03-21 LAB — HM MAMMOGRAPHY

## 2018-03-25 DIAGNOSIS — E063 Autoimmune thyroiditis: Secondary | ICD-10-CM | POA: Diagnosis not present

## 2018-03-25 DIAGNOSIS — E039 Hypothyroidism, unspecified: Secondary | ICD-10-CM | POA: Diagnosis not present

## 2018-06-24 DIAGNOSIS — Z1212 Encounter for screening for malignant neoplasm of rectum: Secondary | ICD-10-CM | POA: Diagnosis not present

## 2018-06-24 DIAGNOSIS — Z1211 Encounter for screening for malignant neoplasm of colon: Secondary | ICD-10-CM | POA: Diagnosis not present

## 2018-07-07 LAB — COLOGUARD: COLOGUARD: NEGATIVE

## 2018-10-06 ENCOUNTER — Ambulatory Visit: Payer: Self-pay | Admitting: Medical

## 2018-10-07 ENCOUNTER — Ambulatory Visit (INDEPENDENT_AMBULATORY_CARE_PROVIDER_SITE_OTHER): Payer: BLUE CROSS/BLUE SHIELD | Admitting: Family Medicine

## 2018-10-07 ENCOUNTER — Ambulatory Visit
Admission: RE | Admit: 2018-10-07 | Discharge: 2018-10-07 | Disposition: A | Discharge status: Home / Self Care | Payer: BLUE CROSS/BLUE SHIELD | Source: Ambulatory Visit | Attending: Family Medicine | Admitting: Family Medicine

## 2018-10-07 ENCOUNTER — Encounter: Payer: Self-pay | Admitting: Family Medicine

## 2018-10-07 VITALS — BP 120/70 | HR 72 | Temp 98.2°F | Ht 64.25 in | Wt 111.6 lb

## 2018-10-07 DIAGNOSIS — M47812 Spondylosis without myelopathy or radiculopathy, cervical region: Secondary | ICD-10-CM | POA: Diagnosis not present

## 2018-10-07 DIAGNOSIS — Z7689 Persons encountering health services in other specified circumstances: Secondary | ICD-10-CM | POA: Diagnosis not present

## 2018-10-07 DIAGNOSIS — M4802 Spinal stenosis, cervical region: Secondary | ICD-10-CM | POA: Diagnosis not present

## 2018-10-07 DIAGNOSIS — M542 Cervicalgia: Secondary | ICD-10-CM | POA: Diagnosis not present

## 2018-10-07 DIAGNOSIS — R29898 Other symptoms and signs involving the musculoskeletal system: Secondary | ICD-10-CM

## 2018-10-07 MED ORDER — METHOCARBAMOL 500 MG PO TABS: 500.0000 mg | ORAL_TABLET | Freq: Three times a day (TID) | ORAL | 0 refills | Status: DC | PRN

## 2018-10-07 NOTE — Progress Notes (Signed)
Subjective:    Patient ID: Allison Koch, female    DOB: May 08, 1956, 63 y.o.   MRN: [id number]  HPI Chief Complaint  Patient presents with  . new pt    new pt, pain in neck, and gets sharp pains in head, stiff neck. trouble turning neck.    She is new to the practice and here to establish care.  Also here for an acute complaint of neck pain.   Complains of a one week history left sided neck pain that started with right sided jaw pain and possible malalignment of her right jaw. She saw her chiropractor for this and had some adjustment done which was painful. The pain and stiffness began the next morning. No known injury, hx of neck pain or surgery per patient.  Reports neck pain and stiffness causing her to have limited ROM of her neck.  This has improved since yesterday at least 80%.  Denies fever, chills, headache, dizziness, vision changes, tinnitus, ear pain, chest pain, palpitations, shortness of breath, abdominal pain, N/V/D.  Denies numbness, tingling or weakness.   Previous medical care: Dr. Luan Pulling in [place].   Other providers:  Cardiologist- Dr. Domenic Polite  At [facility]ology.  OB/GYN- Dr. Matthew Saras at [facility]   Hx of A-fib. Taken off medication by her cardiologist per patient.  History of ?hypothyroidism   Complains of 2 week history of URI symptoms and has been taking Mucinex. Reports this has mostly resolved.   Never had colonoscopy. Has done cologuard.   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   Reviewed allergies, medications, past medical, surgical, and social history.   Review of Systems Pertinent positives and negatives in the history of present illness.     Objective:   Physical Exam Constitutional:      General: She is not in acute distress.    Appearance: Normal appearance. She is not ill-appearing.  HENT:     Head: Normocephalic and atraumatic.     Jaw: No tenderness or pain on movement.     Comments: No  TMJ tenderness    Nose: Nose normal.  Eyes:     Extraocular Movements: Extraocular movements intact.     Conjunctiva/sclera: Conjunctivae normal.     Pupils: Pupils are equal, round, and reactive to light.  Neck:     Musculoskeletal: Neck supple. Pain with movement present. No neck rigidity, spinous process tenderness or muscular tenderness.     Thyroid: No thyromegaly or thyroid tenderness.     Trachea: Trachea and phonation normal.     Comments: Limited ROM with flexion, extension and rotation as well as lateral bending. Pain with movements.  Negative meningeal signs Cardiovascular:     Rate and Rhythm: Normal rate and regular rhythm.  Pulmonary:     Effort: Pulmonary effort is normal.     Breath sounds: Normal breath sounds.  Lymphadenopathy:     Cervical: No cervical adenopathy.  Skin:    General: Skin is warm and dry.     Capillary Refill: Capillary refill takes less than 2 seconds.  Neurological:     General: No focal deficit present.     Mental Status: She is alert and oriented to person, place, and time.     Cranial Nerves: No cranial nerve deficit.     Sensory: No sensory deficit.     Motor: No weakness.     Coordination: Coordination normal.     Gait: Gait normal.  Psychiatric:  Mood and Affect: Mood normal.        Thought Content: Thought content normal.        Judgment: Judgment normal.    BP 120/70   Pulse 72   Temp 98.2 F (36.8 C) (Oral)   Ht 5' 4.25" (1.632 m)   Wt 111 lb 9.6 oz (50.6 kg)   SpO2 98%   BMI 19.01 kg/m        Assessment & Plan:  Decreased range of motion of neck - Plan: DG Cervical Spine Complete, methocarbamol (ROBAXIN) 500 MG tablet  Encounter to establish care  Neck pain - Plan: DG Cervical Spine Complete, methocarbamol (ROBAXIN) 500 MG tablet  She is a pleasant 63 year old female who is new to the practice today and here for an acute complaint of neck pain and stiffness.  No red flag symptoms.  She reports being at least  80% improved since yesterday. Recent realignment of her jaw at the chiropractor prior to onset of symptoms. She has an appointment with her dentist in 2 weeks. History of jaw issues.  She continues to have limited range of motion of her cervical spine.  X-ray is ordered.  Continue with conservative treatment including NSAIDs, heat and if needed I did prescribe her a muscle relaxants, Robaxin.  Advised her to use caution when taking this medication.  Follow-up pending x-ray and if she is not continuing to improve or have any worsening symptoms she will call or return. Requests medical records from previous PCP, cardiologist and OB/GYN.  None of which are in the EMR

## 2018-10-07 NOTE — Patient Instructions (Signed)
Use heat 20 minutes at a time 2-3 times per day.  Topical pain medicine as well.  You may want to take Advil and if you have severe pain or stiffness, you can take the muscle relaxant.   We will call you with your XR result.

## 2018-10-22 ENCOUNTER — Telehealth: Payer: Self-pay | Admitting: Family Medicine

## 2018-10-22 NOTE — Telephone Encounter (Signed)
Received requested records from [facility]. Sending back for review.  °

## 2018-10-27 ENCOUNTER — Encounter: Payer: Self-pay | Admitting: Family Medicine

## 2018-10-28 ENCOUNTER — Telehealth: Payer: Self-pay | Admitting: Family Medicine

## 2018-10-28 NOTE — Telephone Encounter (Signed)
Received requested records from Dr. Sinda Du. Sending back for review.

## 2018-10-31 ENCOUNTER — Encounter: Payer: Self-pay | Admitting: Internal Medicine

## 2018-11-03 ENCOUNTER — Encounter: Payer: Self-pay | Admitting: Family Medicine

## 2019-03-02 DIAGNOSIS — N950 Postmenopausal bleeding: Secondary | ICD-10-CM | POA: Diagnosis not present

## 2019-03-02 DIAGNOSIS — N939 Abnormal uterine and vaginal bleeding, unspecified: Secondary | ICD-10-CM | POA: Diagnosis not present

## 2019-03-03 ENCOUNTER — Encounter: Payer: Self-pay | Admitting: Family Medicine

## 2019-03-30 DIAGNOSIS — Z01419 Encounter for gynecological examination (general) (routine) without abnormal findings: Secondary | ICD-10-CM | POA: Diagnosis not present

## 2019-03-30 DIAGNOSIS — Z1231 Encounter for screening mammogram for malignant neoplasm of breast: Secondary | ICD-10-CM | POA: Diagnosis not present

## 2019-03-30 DIAGNOSIS — N939 Abnormal uterine and vaginal bleeding, unspecified: Secondary | ICD-10-CM | POA: Diagnosis not present

## 2019-03-30 DIAGNOSIS — N951 Menopausal and female climacteric states: Secondary | ICD-10-CM | POA: Diagnosis not present

## 2019-03-30 DIAGNOSIS — Z681 Body mass index (BMI) 19 or less, adult: Secondary | ICD-10-CM | POA: Diagnosis not present

## 2019-04-09 DIAGNOSIS — N950 Postmenopausal bleeding: Secondary | ICD-10-CM | POA: Diagnosis not present

## 2019-04-09 DIAGNOSIS — N840 Polyp of corpus uteri: Secondary | ICD-10-CM | POA: Diagnosis not present

## 2019-04-09 LAB — HM PAP SMEAR: HM Pap smear: NEGATIVE

## 2019-06-17 ENCOUNTER — Encounter: Payer: Self-pay | Admitting: Internal Medicine

## 2019-06-17 ENCOUNTER — Telehealth: Payer: Self-pay | Admitting: Family Medicine

## 2019-06-17 DIAGNOSIS — Z1211 Encounter for screening for malignant neoplasm of colon: Secondary | ICD-10-CM

## 2019-06-17 NOTE — Telephone Encounter (Signed)
Please handle for her. I assume this is a screening colonoscopy?

## 2019-06-17 NOTE — Telephone Encounter (Signed)
Pt called and states that her mother was recently diagnosed with colon cancer and she would like to have a colonoscopy. She has already made an appt with Dr. Hilarie Fredrickson at [place]. She has pre-consult on 10/21 and procedure on 11/04. She is requesting a referral be sent over to them. Pt can be reached at [phone].

## 2019-06-17 NOTE — Telephone Encounter (Signed)
Referral has been placed. 

## 2019-07-08 ENCOUNTER — Ambulatory Visit (AMBULATORY_SURGERY_CENTER): Payer: BC Managed Care – PPO | Admitting: *Deleted

## 2019-07-08 ENCOUNTER — Other Ambulatory Visit: Payer: Self-pay

## 2019-07-08 ENCOUNTER — Encounter: Payer: Self-pay | Admitting: Internal Medicine

## 2019-07-08 VITALS — Temp 97.1°F | Ht 64.0 in | Wt 111.8 lb

## 2019-07-08 DIAGNOSIS — Z1159 Encounter for screening for other viral diseases: Secondary | ICD-10-CM

## 2019-07-08 DIAGNOSIS — Z1211 Encounter for screening for malignant neoplasm of colon: Secondary | ICD-10-CM

## 2019-07-08 MED ORDER — NA SULFATE-K SULFATE-MG SULF 17.5-3.13-1.6 GM/177ML PO SOLN: 1.0000 | Freq: Once | ORAL | 0 refills | Status: AC

## 2019-07-08 NOTE — Progress Notes (Signed)
No egg or soy allergy known to patient  No issues with past sedation with any surgeries  or procedures, no intubation problems  No diet pills per patient No home 02 use per patient  No blood thinners per patient  Pt denies issues with constipation  No A fib or A flutter  EMMI video sent to pt's e mail   Due to the COVID-19 pandemic we are asking patients to follow these guidelines. Please only bring one care partner. Please be aware that your care partner may wait in the car in the parking lot or if they feel like they will be too hot to wait in the car, they may wait in the lobby on the 4th floor. All care partners are required to wear a mask the entire time (we do not have any that we can provide them), they need to practice social distancing, and we will do a Covid check for all patient's and care partners when you arrive. Also we will check their temperature and your temperature. If the care partner waits in their car they need to stay in the parking lot the entire time and we will call them on their cell phone when the patient is ready for discharge so they can bring the car to the front of the building. Also all patient's will need to wear a mask into building.  suprep coupon provided.  covid screening 07/20/19, 11 am

## 2019-07-19 DIAGNOSIS — D369 Benign neoplasm, unspecified site: Secondary | ICD-10-CM

## 2019-07-19 HISTORY — DX: Benign neoplasm, unspecified site: D36.9

## 2019-07-19 HISTORY — PX: COLONOSCOPY: SHX174

## 2019-07-20 ENCOUNTER — Other Ambulatory Visit: Payer: Self-pay | Admitting: Internal Medicine

## 2019-07-20 LAB — SARS CORONAVIRUS 2 (TAT 6-24 HRS): SARS Coronavirus 2: NEGATIVE

## 2019-07-22 ENCOUNTER — Other Ambulatory Visit: Payer: Self-pay

## 2019-07-22 ENCOUNTER — Ambulatory Visit (AMBULATORY_SURGERY_CENTER): Payer: BC Managed Care – PPO | Admitting: Internal Medicine

## 2019-07-22 ENCOUNTER — Encounter: Payer: Self-pay | Admitting: Internal Medicine

## 2019-07-22 VITALS — BP 129/53 | HR 74 | Temp 97.1°F | Resp 14 | Ht 64.0 in | Wt 111.0 lb

## 2019-07-22 DIAGNOSIS — D122 Benign neoplasm of ascending colon: Secondary | ICD-10-CM | POA: Diagnosis not present

## 2019-07-22 DIAGNOSIS — Z1211 Encounter for screening for malignant neoplasm of colon: Secondary | ICD-10-CM

## 2019-07-22 DIAGNOSIS — D123 Benign neoplasm of transverse colon: Secondary | ICD-10-CM

## 2019-07-22 DIAGNOSIS — D120 Benign neoplasm of cecum: Secondary | ICD-10-CM

## 2019-07-22 LAB — HM COLONOSCOPY

## 2019-07-22 MED ORDER — SODIUM CHLORIDE 0.9 % IV SOLN: 500.0000 mL | INTRAVENOUS | Status: DC

## 2019-07-22 NOTE — Patient Instructions (Signed)
YOU HAD AN ENDOSCOPIC PROCEDURE TODAY AT THE [facility]:   Refer to the procedure report that was given to you for any specific questions about what was found during the examination.  If the procedure report does not answer your questions, please call your gastroenterologist to clarify.  If you requested that your care partner not be given the details of your procedure findings, then the procedure report has been included in a sealed envelope for you to review at your convenience later.  YOU SHOULD EXPECT: Some feelings of bloating in the abdomen. Passage of more gas than usual.  Walking can help get rid of the air that was put into your GI tract during the procedure and reduce the bloating. If you had a lower endoscopy (such as a colonoscopy or flexible sigmoidoscopy) you may notice spotting of blood in your stool or on the toilet paper. If you underwent a bowel prep for your procedure, you may not have a normal bowel movement for a few days.  Please Note:  You might notice some irritation and congestion in your nose or some drainage.  This is from the oxygen used during your procedure.  There is no need for concern and it should clear up in a day or so.  SYMPTOMS TO REPORT IMMEDIATELY:   Following lower endoscopy (colonoscopy or flexible sigmoidoscopy):  Excessive amounts of blood in the stool  Significant tenderness or worsening of abdominal pains  Swelling of the abdomen that is new, acute  Fever of 100F or higher   For urgent or emergent issues, a gastroenterologist can be reached at any hour by calling [phone].   DIET:  We do recommend a small meal at first, but then you may proceed to your regular diet.  Drink plenty of fluids but you should avoid alcoholic beverages for 24 hours.  MEDICATIONS: Continue present medications.  Please see handouts given to you by your recovery nurse.  ACTIVITY:  You should plan to take it easy for the rest of today and you should  NOT DRIVE or use heavy machinery until tomorrow (because of the sedation medicines used during the test).    FOLLOW UP: Our staff will call the number listed on your records 48-72 hours following your procedure to check on you and address any questions or concerns that you may have regarding the information given to you following your procedure. If we do not reach you, we will leave a message.  We will attempt to reach you two times.  During this call, we will ask if you have developed any symptoms of COVID 19. If you develop any symptoms (ie: fever, flu-like symptoms, shortness of breath, cough etc.) before then, please call [phone].  If you test positive for Covid 19 in the 2 weeks post procedure, please call and report this information to us.    If any biopsies were taken you will be contacted by phone or by letter within the next 1-3 weeks.  Please call us at [phone] if you have not heard about the biopsies in 3 weeks.   Thank you for allowing us to provide for your healthcare needs today.   SIGNATURES/CONFIDENTIALITY: You and/or your care partner have signed paperwork which will be entered into your electronic medical record.  These signatures attest to the fact that that the information above on your After Visit Summary has been reviewed and is understood.  Full responsibility of the confidentiality of this discharge information lies with you and/or   your care-partner. 

## 2019-07-22 NOTE — Progress Notes (Signed)
To PACU, VSS. Report to [email or web address] 

## 2019-07-22 NOTE — Progress Notes (Signed)
VS Allison Koch June  Pt's states no medical or surgical changes since previsit or office visit.

## 2019-07-22 NOTE — Progress Notes (Signed)
Called to room to assist during endoscopic procedure.  Patient ID and intended procedure confirmed with present staff. Received instructions for my participation in the procedure from the performing physician.  

## 2019-07-22 NOTE — Op Note (Signed)
[facility] Patient Name: Allison Koch Procedure Date: 07/22/2019 8:09 AM MRN: [id number] Endoscopist: Jerene Bears , MD Age: 63 Referring MD:  Date of Birth: 07/14/1956 Gender: Female Account #: [id number] Procedure:                Colonoscopy Indications:              Screening patient at increased risk: Family history                            of 1st-degree relative (mother) with colorectal                            cancer at age 12 years (or older), This is the                            patient's first colonoscopy Medicines:                Monitored Anesthesia Care Procedure:                Pre-Anesthesia Assessment:                           - Prior to the procedure, a History and Physical                            was performed, and patient medications and                            allergies were reviewed. The patient's tolerance of                            previous anesthesia was also reviewed. The risks                            and benefits of the procedure and the sedation                            options and risks were discussed with the patient.                            All questions were answered, and informed consent                            was obtained. Prior Anticoagulants: The patient has                            taken no previous anticoagulant or antiplatelet                            agents. ASA Grade Assessment: II - A patient with                            mild systemic disease. After reviewing the risks  and benefits, the patient was deemed in                            satisfactory condition to undergo the procedure.                           After obtaining informed consent, the colonoscope                            was passed under direct vision. Throughout the                            procedure, the patient's blood pressure, pulse, and                            oxygen saturations were monitored  continuously. The                            Colonoscope was introduced through the anus and                            advanced to the cecum, identified by appendiceal                            orifice and ileocecal valve. The colonoscopy was                            performed without difficulty. The patient tolerated                            the procedure well. The quality of the bowel                            preparation was good. The ileocecal valve,                            appendiceal orifice, and rectum were photographed. Scope In: 8:17:15 AM Scope Out: 8:36:25 AM Scope Withdrawal Time: 0 hours 14 minutes 22 seconds  Total Procedure Duration: 0 hours 19 minutes 10 seconds  Findings:                 The perianal and digital rectal examinations were                            normal.                           Two sessile polyps were found in the cecum. The                            polyps were 2 to 3 mm in size. These polyps were                            removed with a cold snare. Resection and retrieval  were complete.                           A 3 mm polyp was found in the ascending colon. The                            polyp was sessile. The polyp was removed with a                            cold snare. Resection and retrieval were complete.                           A 3 mm polyp was found in the transverse colon. The                            polyp was sessile. The polyp was removed with a                            cold snare. Resection and retrieval were complete.                           The exam was otherwise without abnormality on                            direct and retroflexion views. Complications:            No immediate complications. Estimated Blood Loss:     Estimated blood loss was minimal. Impression:               - Two 2 to 3 mm polyps in the cecum, removed with a                            cold snare. Resected and  retrieved.                           - One 3 mm polyp in the ascending colon, removed                            with a cold snare. Resected and retrieved.                           - One 3 mm polyp in the transverse colon, removed                            with a cold snare. Resected and retrieved.                           - The examination was otherwise normal on direct                            and retroflexion views. Recommendation:           - Patient has a contact number available for  emergencies. The signs and symptoms of potential                            delayed complications were discussed with the                            patient. Return to normal activities tomorrow.                            Written discharge instructions were provided to the                            patient.                           - Resume previous diet.                           - Continue present medications.                           - Await pathology results.                           - Repeat colonoscopy is recommended for                            surveillance. The colonoscopy date will be                            determined after pathology results from today's                            exam become available for review. Jerene Bears, MD 07/22/2019 8:50:08 AM This report has been signed electronically.

## 2019-07-24 ENCOUNTER — Other Ambulatory Visit: Payer: Self-pay

## 2019-07-24 ENCOUNTER — Telehealth: Payer: Self-pay

## 2019-07-24 NOTE — Telephone Encounter (Signed)
  Follow up Call-  Call back number 07/22/2019  Post procedure Call Back phone  # [phone]  Permission to leave phone message Yes  Some recent data might be hidden     Patient questions:  Do you have a fever, pain , or abdominal swelling? No. Pain Score  0 *  Have you tolerated food without any problems? Yes.    Have you been able to return to your normal activities? Yes.    Do you have any questions about your discharge instructions: Diet   No. Medications  No. Follow up visit  No.  Do you have questions or concerns about your Care? No.  Actions: * If pain score is 4 or above: No action needed, pain <4.  Questioned when to expect a BM.  RN advised can be several days.  Paperwork states 1-3 days.  Depends on amount eaten.  Pt voiced that she is a very light eater.  RN advised to call again if further concerns such as constipation.  Pt agreed.

## 2019-07-27 ENCOUNTER — Telehealth: Payer: Self-pay | Admitting: Internal Medicine

## 2019-07-27 NOTE — Telephone Encounter (Signed)
I am glad that she let me know about her symptoms after colonoscopy  I would recommend that she notify Dr. Domenic Polite of these symptoms just to ensure this was not a cardiac event (substernal chest pain radiating to the left arm and thumb) If this pain continues or returns/worsens she needs to be seen in the emergency department

## 2019-07-27 NOTE — Telephone Encounter (Signed)
Pt states she thinks she may have had an allergic reaction to the anesthesia. Reports after the procedure she had pain between her shoulder blades that went down her left arm even into her left thumb. States she took advil and used the heating pad and it is almost gone now. Her husbands cousin stated that the same thing happened to her and she was told she had an allergic reaction to the anesthesia. She wanted to let Dr. Hilarie Fredrickson know.

## 2019-07-27 NOTE — Telephone Encounter (Signed)
Noted.  We follow Allison Koch with a history of previous atrial fibrillation, but not any known history of CAD or myocardial infarction.  Generally, risk of acute cardiac events with endoscopy is low, including with the sedation used.  I will have nursing reach out to her to see if she had resolution of symptoms or whether we need to consider any follow-up for this.

## 2019-07-27 NOTE — Telephone Encounter (Signed)
Left message for pt to call back.  Spoke with pt and she is aware and states she will let Dr. Domenic Polite know.

## 2019-07-28 ENCOUNTER — Encounter: Payer: Self-pay | Admitting: Internal Medicine

## 2019-07-28 ENCOUNTER — Telehealth: Payer: Self-pay | Admitting: Cardiology

## 2019-07-28 NOTE — Telephone Encounter (Signed)
Reports pain in left arm between elbow and shoulder. Says she took advil and symptoms improved. Denies chest pain, dizziness or sob. Advised this may be non-cardiac in which patient agrees. Patient has already been scheduled for a virtual visit with Dr. Domenic Polite for tomorrow.

## 2019-07-28 NOTE — Progress Notes (Signed)
Virtual Visit via Telephone Note   This visit type was conducted due to national recommendations for restrictions regarding the COVID-19 Pandemic (e.g. social distancing) in an effort to limit this patient's exposure and mitigate transmission in our community.  Due to her co-morbid illnesses, this patient is at least at moderate risk for complications without adequate follow up.  This format is felt to be most appropriate for this patient at this time.  The patient did not have access to video technology/had technical difficulties with video requiring transitioning to audio format only (telephone).  All issues noted in this document were discussed and addressed.  No physical exam could be performed with this format.  Please refer to the patient's chart for her  consent to telehealth for [facility].   Date:  07/29/2019   ID:  ADRIYANA ALEXIS, DOB 09-May-1956, MRN [id number]  Patient Location: Home Provider Location: Office  PCP:  Medicine, [place]  Cardiologist:  Rozann Lesches, MD Electrophysiologist:  None   Evaluation Performed:  Follow-Up Visit  Chief Complaint:   Cardiac follow-up  History of Present Illness:    PELE WEIDERT is a 63 y.o. female last seen in February 2019.  We spoke by phone today.  She does not report any obvious breakthrough atrial fibrillation since last assessment, no palpitations or unusual shortness of breath, no dizziness or syncope.  She underwent a colonoscopy recently with Dr. Hilarie Fredrickson on November 4.  She states that she felt a mild discomfort in her mid scapular area and left shoulder after she got home.  This was much worse the following day, more of a sharp constant pain, limited movement of her left arm with pain into her thumb as well.  She used a hot pad and took Advil, the symptoms waxed and waned for several days and within the last 24 to 48 hours have gotten better.  She does not report any active pain today.  Last ECG was in  February 2019 as outlined below.  We will plan to set up a time for her to come by the office today and get a follow-up ECG just to make sure there are no significant changes.  The patient does not have symptoms concerning for COVID-19 infection (fever, chills, cough, or new shortness of breath).  Recent SARS coronavirus 2 test was negative.   Past Medical History:  Diagnosis Date  . Hypothyroidism   . PAF (paroxysmal atrial fibrillation) (HCC)    Noted in setting of acute illness   Past Surgical History:  Procedure Laterality Date  . EYE SURGERY    . TONSILLECTOMY    . UTERINE FIBROID SURGERY    . WISDOM TOOTH EXTRACTION       Current Meds  Medication Sig  . Calcium Carbonate-Vitamin D 600-400 MG-UNIT per chew tablet Chew 1 tablet by mouth 2 (two) times daily.    . COMBIPATCH 0.05-0.14 MG/DAY Place 1 patch onto the skin 2 (two) times a week.   Marland Kitchen ibuprofen (ADVIL,MOTRIN) 200 MG tablet Take 600 mg by mouth every 8 (eight) hours as needed. inflammation   . levothyroxine (SYNTHROID, LEVOTHROID) 75 MCG tablet Take 75 mcg by mouth daily before breakfast.   Current Facility-Administered Medications for the 07/29/19 encounter (Telemedicine) with Satira Sark, MD  Medication  . 0.9 %  sodium chloride infusion     Allergies:   Other and Penicillins   Social History   Tobacco Use  . Smoking status: Never Smoker  .  Smokeless tobacco: Never Used  Substance Use Topics  . Alcohol use: No  . Drug use: No     Family Hx: The patient's family history includes Arrhythmia in her mother; Colon cancer in her mother; Heart attack in her father; Heart failure in her father. There is no history of Esophageal cancer, Rectal cancer, or Stomach cancer.  ROS:   Please see the history of present illness. All other systems reviewed and are negative.   Prior CV studies:   The following studies were reviewed today:  Echocardiogram 08/22/2011: Study Conclusions  - Left ventricle: The  cavity size was normal. Borderline concentric hypertrophy. Systolic function was vigorous. The estimated ejection fraction was in the range of 65% to 70%. Wall motion was normal; there were no regional wall motion abnormalities. - Aortic valve: Mildly calcified annulus. Normal thickness leaflets. - Right ventricle: The cavity size was normal. Wall thickness was mildly increased. Systolic function was hyperdynamic. - Atrial septum: No defect or patent foramen ovale was identified.  Labs/Other Tests and Data Reviewed:    EKG:  An ECG dated 11/12/2017 was personally reviewed today and demonstrated:  Sinus rhythm with nonspecific ST changes.  Recent Labs:  July 2019: TSH 1.45 May 2019: Cholesterol 199, HDL 53, triglycerides 77, LDL 129, BUN 9, creatinine 0.88, potassium 4.8, AST 22, ALT 8, hemoglobin 13.8, platelets 347  Wt Readings from Last 3 Encounters:  07/29/19 112 lb (50.8 kg)  07/22/19 111 lb (50.3 kg)  07/08/19 111 lb 12.8 oz (50.7 kg)     Objective:    Vital Signs:  BP 122/74   Ht 5\' 5"  (1.651 m)   Wt 112 lb (50.8 kg)   BMI 18.64 kg/m    Patient spoke in full sentences, not short of breath. No audible wheezing or coughing. Speech pattern normal.  ASSESSMENT & PLAN:    1.  Recent thoracic, left shoulder and arm discomfort after routine colonoscopy as described above.  She wondered if she "pinched a nerve somehow."  With hot pads and Advil symptoms have resolved, she does not report any discomfort today, no palpitations to suspect recurrent atrial fibrillation.  She also does not describe any abdominal pain or change in stools to suggest perforation with intra-abdominal air.  Does not necessarily sound like a reaction to sedative medications either.  We will have her come by the [place] office today for an ECG to compare to last year's tracing.  If no substantial change, no for further cardiac testing at this time.  2.  History of atrial fibrillation  in the past, noted in the setting of acute illness and without obvious recurrences.  We are simply observing at this point.  She is not on any AV nodal blockers.  Follow-up ECG to be obtained.  COVID-19 Education: The signs and symptoms of COVID-19 were discussed with the patient and how to seek care for testing (follow up with PCP or arrange E-visit).  The importance of social distancing was discussed today.  Time:   Today, I have spent 12 minutes with the patient with telehealth technology discussing the above problems.     Medication Adjustments/Labs and Tests Ordered: Current medicines are reviewed at length with the patient today.  Concerns regarding medicines are outlined above.   Tests Ordered: No orders of the defined types were placed in this encounter.   Medication Changes: No orders of the defined types were placed in this encounter.   Follow Up:  In Person 1 year in the [place]  office.  Signed, Rozann Lesches, MD  07/29/2019 9:43 AM    [place]

## 2019-07-28 NOTE — Telephone Encounter (Signed)
Virtual Visit Pre-Appointment Phone Call  "(Name), I am calling you today to discuss your upcoming appointment. We are currently trying to limit exposure to the virus that causes COVID-19 by seeing patients at home rather than in the office."  1. "What is the BEST phone number to call the day of the visit?" - include this in appointment notes  2. Do you have or have access to (through a family member/friend) a smartphone with video capability that we can use for your visit?" a. If yes - list this number in appt notes as cell (if different from BEST phone #) and list the appointment type as a VIDEO visit in appointment notes b. If no - list the appointment type as a PHONE visit in appointment notes  Confirm consent - "In the setting of the current Covid19 crisis, you are scheduled for a (phone or video) visit with your provider on (date) at (time).  Just as we do with many in-office visits, in order for you to participate in this visit, we must obtain consent.  If you'd like, I can send this to your mychart (if signed up) or email for you to review.  Otherwise, I can obtain your verbal consent now.  All virtual visits are billed to your insurance company just like a normal visit would be.  By agreeing to a virtual visit, we'd like you to understand that the technology does not allow for your provider to perform an examination, and thus may limit your provider's ability to fully assess your condition. If your provider identifies any concerns that need to be evaluated in person, we will make arrangements to do so.  Finally, though the technology is pretty good, we cannot assure that it will always work on either your or our end, and in the setting of a video visit, we may have to convert it to a phone-only visit.  In either situation, we cannot ensure that we have a secure connection.  Are you willing to proceed?" STAFF: Did the patient verbally acknowledge consent to telehealth visit? Document  YES/NO here: yes 3. Advise patient to be prepared - "Two hours prior to your appointment, go ahead and check your blood pressure, pulse, oxygen saturation, and your weight (if you have the equipment to check those) and write them all down. When your visit starts, your provider will ask you for this information. If you have an Apple Watch or Kardia device, please plan to have heart rate information ready on the day of your appointment. Please have a pen and paper handy nearby the day of the visit as well."  4. Give patient instructions for MyChart download to smartphone OR Doximity/Doxy.me as below if video visit (depending on what platform provider is using)  5. Inform patient they will receive a phone call 15 minutes prior to their appointment time (may be from unknown caller ID) so they should be prepared to answer    TELEPHONE CALL NOTE  MINAMI ANCELET has been deemed a candidate for a follow-up tele-health visit to limit community exposure during the Covid-19 pandemic. I spoke with the patient via phone to ensure availability of phone/video source, confirm preferred email & phone number, and discuss instructions and expectations.  I reminded SOLANCH RICHOUX to be prepared with any vital sign and/or heart rhythm information that could potentially be obtained via home monitoring, at the time of her visit. I reminded BETTYLEE STANDREW to expect a phone call prior to her visit.  Weston Anna 07/28/2019 10:45 AM   INSTRUCTIONS FOR DOWNLOADING THE MYCHART APP TO SMARTPHONE  - The patient must first make sure to have activated MyChart and know their login information - If Apple, go to [employer] and type in MyChart in the search bar and download the app. If Android, ask patient to go to [employer] and type in [place] in the search bar and download the app. The app is free but as with any other app downloads, their phone may require them to verify saved payment information or Apple/Android  password.  - The patient will need to then log into the app with their MyChart username and password, and select  as their healthcare provider to link the account. When it is time for your visit, go to the MyChart app, find appointments, and click Begin Video Visit. Be sure to Select Allow for your device to access the Microphone and Camera for your visit. You will then be connected, and your provider will be with you shortly.  **If they have any issues connecting, or need assistance please contact MyChart service desk [phone])**  **If using a computer, in order to ensure the best quality for their visit they will need to use either of the following Internet Browsers: [employer], or Google Chrome**  IF USING DOXIMITY or DOXY.ME - The patient will receive a link just prior to their visit by text.     FULL LENGTH CONSENT FOR TELE-HEALTH VISIT   I hereby voluntarily request, consent and authorize [facility] and its employed or contracted physicians, physician assistants, nurse practitioners or other licensed health care professionals (the Practitioner), to provide me with telemedicine health care services (the Services") as deemed necessary by the treating Practitioner. I acknowledge and consent to receive the Services by the Practitioner via telemedicine. I understand that the telemedicine visit will involve communicating with the Practitioner through live audiovisual communication technology and the disclosure of certain medical information by electronic transmission. I acknowledge that I have been given the opportunity to request an in-person assessment or other available alternative prior to the telemedicine visit and am voluntarily participating in the telemedicine visit.  I understand that I have the right to withhold or withdraw my consent to the use of telemedicine in the course of my care at any time, without affecting my right to future care or treatment,  and that the Practitioner or I may terminate the telemedicine visit at any time. I understand that I have the right to inspect all information obtained and/or recorded in the course of the telemedicine visit and may receive copies of available information for a reasonable fee.  I understand that some of the potential risks of receiving the Services via telemedicine include:   Delay or interruption in medical evaluation due to technological equipment failure or disruption;  Information transmitted may not be sufficient (e.g. poor resolution of images) to allow for appropriate medical decision making by the Practitioner; and/or   In rare instances, security protocols could fail, causing a breach of personal health information.  Furthermore, I acknowledge that it is my responsibility to provide information about my medical history, conditions and care that is complete and accurate to the best of my ability. I acknowledge that Practitioner's advice, recommendations, and/or decision may be based on factors not within their control, such as incomplete or inaccurate data provided by me or distortions of diagnostic images or specimens that may result from electronic transmissions. I understand that the  practice of medicine is not an Chief Strategy Officer and that Practitioner makes no warranties or guarantees regarding treatment outcomes. I acknowledge that I will receive a copy of this consent concurrently upon execution via email to the email address I last provided but may also request a printed copy by calling the office of [facility].    I understand that my insurance will be billed for this visit.   I have read or had this consent read to me.  I understand the contents of this consent, which adequately explains the benefits and risks of the Services being provided via telemedicine.   I have been provided ample opportunity to ask questions regarding this consent and the Services and have had my questions  answered to my satisfaction.  I give my informed consent for the services to be provided through the use of telemedicine in my medical care  By participating in this telemedicine visit I agree to the above.

## 2019-07-29 ENCOUNTER — Telehealth (INDEPENDENT_AMBULATORY_CARE_PROVIDER_SITE_OTHER): Payer: BC Managed Care – PPO | Admitting: Cardiology

## 2019-07-29 ENCOUNTER — Encounter: Payer: Self-pay | Admitting: Cardiology

## 2019-07-29 VITALS — BP 122/74 | Ht 65.0 in | Wt 112.0 lb

## 2019-07-29 DIAGNOSIS — Z8679 Personal history of other diseases of the circulatory system: Secondary | ICD-10-CM | POA: Diagnosis not present

## 2019-07-29 DIAGNOSIS — R0789 Other chest pain: Secondary | ICD-10-CM

## 2019-07-29 NOTE — Patient Instructions (Signed)

## 2019-07-29 NOTE — Addendum Note (Signed)
Addended byDebbora Lacrosse R on: 07/29/2019 03:01 PM   Modules accepted: Orders

## 2019-08-31 ENCOUNTER — Encounter: Payer: Self-pay | Admitting: Internal Medicine

## 2019-09-01 ENCOUNTER — Encounter: Payer: Self-pay | Admitting: Family Medicine

## 2019-09-14 DIAGNOSIS — M79672 Pain in left foot: Secondary | ICD-10-CM | POA: Diagnosis not present

## 2019-09-21 DIAGNOSIS — M79672 Pain in left foot: Secondary | ICD-10-CM | POA: Diagnosis not present

## 2019-10-05 DIAGNOSIS — M79672 Pain in left foot: Secondary | ICD-10-CM | POA: Diagnosis not present

## 2020-03-24 DIAGNOSIS — Z23 Encounter for immunization: Secondary | ICD-10-CM | POA: Diagnosis not present

## 2020-03-31 DIAGNOSIS — Z6827 Body mass index (BMI) 27.0-27.9, adult: Secondary | ICD-10-CM | POA: Diagnosis not present

## 2020-03-31 DIAGNOSIS — Z1231 Encounter for screening mammogram for malignant neoplasm of breast: Secondary | ICD-10-CM | POA: Diagnosis not present

## 2020-03-31 DIAGNOSIS — Z01419 Encounter for gynecological examination (general) (routine) without abnormal findings: Secondary | ICD-10-CM | POA: Diagnosis not present

## 2020-03-31 DIAGNOSIS — Z13820 Encounter for screening for osteoporosis: Secondary | ICD-10-CM | POA: Diagnosis not present

## 2020-03-31 LAB — HM MAMMOGRAPHY

## 2020-03-31 LAB — HM DEXA SCAN

## 2020-04-15 DIAGNOSIS — Z23 Encounter for immunization: Secondary | ICD-10-CM | POA: Diagnosis not present

## 2020-05-02 ENCOUNTER — Telehealth: Payer: Self-pay | Admitting: Medical

## 2020-05-02 NOTE — Telephone Encounter (Signed)
Pt called & would like a call from you if possible.  States nerves tore up would like something called in.  States has 2 people has been cleaning 64 year old moms house her house and she has POA and having issues with them and she is very concerned.  She would like you to call her when you have a chance.  Asked that you call her cell and if you can't reach her at cell please call her work number.

## 2020-05-03 ENCOUNTER — Other Ambulatory Visit: Payer: Self-pay | Admitting: Medical

## 2020-05-03 MED ORDER — ALPRAZOLAM 0.5 MG PO TABS: 0.5000 mg | ORAL_TABLET | Freq: Every evening | ORAL | 0 refills | Status: DC | PRN

## 2020-05-03 NOTE — Telephone Encounter (Signed)
I called Monday afternoon and left message.  I will call back Tuesday

## 2020-05-03 NOTE — Telephone Encounter (Signed)
Pt called to say sorry she missed your call yesterday and she got your message that you would call her back today but she will be unavailable from 9:15-11:15 but other than that anytime should work and thank you very much

## 2020-05-03 NOTE — Telephone Encounter (Signed)
Sounds like an appropriate plan.

## 2020-05-03 NOTE — Telephone Encounter (Signed)
I spoke to [place] who I know as a friend as well as patient here.  I see her husband Jeanell Sparrow and son Teressa Senter.  Nykole is worried about, having a lot of anxiety about her mother.  Mother is 24.  Fell in July, broke her hand.  Her mental comprehension, reasoning has gotten bad.  She is unable to take care of herself independently.  Recently the family asked a caregiver nurse Luellen Pucker to come in and care for mom 24/7.  They know and trust Luellen Pucker, and Luellen Pucker has been coming out.  However, Audrey's schedule doesn't allow her to be there as much of late, so they ended up asking other aids from [facility] to come out on suggestion of an acquaintance.     However since the new aids have been in, [place] is very worried.   They are paid to watch her mother, to clean up around the house, to take care of mother's ADL, but they notice they sit around with her mother and gossip and carry on for hours.  They notice mother's demeanor changes and is more negative when they are over there.   They feel something is not quite right.   They are losing trust in the 2 aides that come out.  In speaking with friends, they have been warned by friends who have reportedly had bad experiences with these 2 nurse aides.  They are not sure what the motive is but question whether the nurse aides are trying to drive a wedge between mom and the family for their personal or financial gain.   They have noticed other problem.   Recently the 1 aide asked to be paid a week in advance out of the blue.  When [place] refused she became ugly in personality and nasty about the refusal.  There have been other tense interactions between [place] and the aides.   However, Conception's mother seems to enjoy the [employer].  She worries that her they may be trying to take advantage of her mother.   Fayth wants advice on how to dismiss /discharge the nurse aides but at the same time smooth things over with mother as she knows her mother is going to be upset if [employer] the  aides.    [place] is asking for some short term medication to calm her nerves and help with sleep.   She is not sleeping due to the stress and anxiety.  She also notes that her husband Jeanell Sparrow and son Teressa Senter also feel something isn't quite right with these 2 caregivers.   I advised Kersti that I would call out some short term medication.  I discussed risks/benefits of medication.  I asked her to do a follow up visit with me or Vickie within a week or 2.  I gave Fran some general advice including she needs to call mom's PCP to outline her concerns, get apt with PCP soon to discuss, request referral to neurology for memory and ADL concerns.  Advised Jeriyah secure valuables, banking account info in the home to avoid nurse aides getting assess to those items [place] is power of attorney and only child).   Advised Ercilia contact her friend for some legal advice as well.

## 2020-05-05 NOTE — Telephone Encounter (Signed)
done

## 2020-06-14 ENCOUNTER — Telehealth: Payer: Self-pay

## 2020-06-14 MED ORDER — LEVOTHYROXINE SODIUM 75 MCG PO TABS: 75.0000 ug | ORAL_TABLET | Freq: Every day | ORAL | 0 refills | Status: DC

## 2020-06-14 NOTE — Telephone Encounter (Signed)
Received a fax from [facility] for a refill on the pts. Levothyroxine pts. Last apt was 10/07/18.

## 2020-06-14 NOTE — Telephone Encounter (Signed)
Left detailed message that pt has not been in in over a year and needs an appt and that we have never refilled this for her so she needs an appointment before we can do this.   Please deny

## 2020-06-14 NOTE — Telephone Encounter (Signed)
Pt called and states that she is actually a Allison Koch pt but had to see Vickie last and only visit. Pt is requesting refills on levothyroxine. She has not been seen in over a year. I made her an appt with Shane for 06/30/2020. She is requesting refills to [facility] until that appt.

## 2020-06-30 ENCOUNTER — Other Ambulatory Visit: Payer: Self-pay

## 2020-06-30 ENCOUNTER — Encounter: Payer: Self-pay | Admitting: Medical

## 2020-06-30 ENCOUNTER — Ambulatory Visit (INDEPENDENT_AMBULATORY_CARE_PROVIDER_SITE_OTHER): Payer: BC Managed Care – PPO | Admitting: Medical

## 2020-06-30 VITALS — BP 124/70 | HR 84 | Ht 65.0 in | Wt 113.6 lb

## 2020-06-30 DIAGNOSIS — Z0000 Encounter for general adult medical examination without abnormal findings: Secondary | ICD-10-CM

## 2020-06-30 DIAGNOSIS — E0590 Thyrotoxicosis, unspecified without thyrotoxic crisis or storm: Secondary | ICD-10-CM | POA: Insufficient documentation

## 2020-06-30 DIAGNOSIS — Z8679 Personal history of other diseases of the circulatory system: Secondary | ICD-10-CM | POA: Diagnosis not present

## 2020-06-30 DIAGNOSIS — M2550 Pain in unspecified joint: Secondary | ICD-10-CM

## 2020-06-30 DIAGNOSIS — Z636 Dependent relative needing care at home: Secondary | ICD-10-CM

## 2020-06-30 DIAGNOSIS — E039 Hypothyroidism, unspecified: Secondary | ICD-10-CM

## 2020-06-30 DIAGNOSIS — Z7185 Encounter for immunization safety counseling: Secondary | ICD-10-CM | POA: Diagnosis not present

## 2020-06-30 DIAGNOSIS — Z13220 Encounter for screening for lipoid disorders: Secondary | ICD-10-CM | POA: Diagnosis not present

## 2020-06-30 DIAGNOSIS — Z1321 Encounter for screening for nutritional disorder: Secondary | ICD-10-CM | POA: Diagnosis not present

## 2020-06-30 DIAGNOSIS — D369 Benign neoplasm, unspecified site: Secondary | ICD-10-CM

## 2020-06-30 DIAGNOSIS — M2540 Effusion, unspecified joint: Secondary | ICD-10-CM

## 2020-06-30 DIAGNOSIS — F430 Acute stress reaction: Secondary | ICD-10-CM | POA: Insufficient documentation

## 2020-06-30 LAB — POCT URINALYSIS DIP (PROADVANTAGE DEVICE)
Bilirubin, UA: NEGATIVE
Glucose, UA: NEGATIVE mg/dL
Ketones, POC UA: NEGATIVE mg/dL
Leukocytes, UA: NEGATIVE
Nitrite, UA: NEGATIVE
Specific Gravity, Urine: 1.03
Urobilinogen, Ur: 0.2
pH, UA: 6 (ref 5.0–8.0)

## 2020-06-30 LAB — LDL CHOLESTEROL, DIRECT

## 2020-06-30 NOTE — Progress Notes (Signed)
Established patient visit   Patient: Allison Koch   DOB: 1956/05/22   64 y.o. Female  MRN: [id number] Visit Date: 06/30/2020  Today's healthcare provider: Dorothea Ogle, PA-C   Chief Complaint  Patient presents with  . Annual Exam   Subjective    HPI   Dr. Matthew Saras, [facility] for [facility] Dentist Eye doctor Dr. Cyd Silence, orthopedics Dr. Rozann Lesches, cardiology Dr. Zenovia Jarred, gastroenterology   Follow up for Thyroid   The patient was last seen for this 1-2 years ago. She has being She reports good compliance with treatment. She feels that condition is stable, has been on same treatment for years . She is not having side effects.    Anxiety, Follow-up  She was last seen for anxiety 2 months ago. Changes made at last visit include - short term medication prescribed. She reports that she never started taking Xanax for anxiety. She feels her anxiety has Improved since last conversation about it.  Symptoms: No chest pain No difficulty concentrating  No dizziness No fatigue  No feelings of losing control No insomnia  No irritable No palpitations  No panic attacks No racing thoughts  No shortness of breath No sweating  No tremors/shakes     GAD-7 Results GAD-7 Generalized Anxiety Disorder Screening Tool 06/30/2020  1. Feeling Nervous, Anxious, or on Edge 0  2. Not Being Able to Stop or Control Worrying 0  3. Worrying Too Much About Different Things 0  4. Trouble Relaxing 0  5. Being So Restless it's Hard To Sit Still 0  6. Becoming Easily Annoyed or Irritable 0  7. Feeling Afraid As If Something Awful Might Happen 0  Total GAD-7 Score 0  Difficulty At Work, Home, or Getting  Along With Others? Not difficult at all    Past Medical History:  Diagnosis Date  . Hypothyroidism   . PAF (paroxysmal atrial fibrillation) ([place])    Noted in setting of acute illness  . Tubular adenoma 07/2019   colonoscopy, Dr. Zenovia Jarred    Family History    Problem Relation Age of Onset  . Arrhythmia Mother        atrial fib  . Colon cancer Mother 72  . Dementia Mother   . COPD Mother   . Cancer Mother   . Heart attack Father        CABG x9, maybe in 35s  . Heart failure Father        died of CHF  . Diabetes Father   . Esophageal cancer Neg Hx   . Rectal cancer Neg Hx   . Stomach cancer Neg Hx   . Heart disease Neg Hx      Current Outpatient Medications:  .  Calcium Carbonate-Vitamin D 600-400 MG-UNIT per chew tablet, Chew 1 tablet by mouth 2 (two) times daily.  , Disp: , Rfl:  .  COMBIPATCH 0.05-0.14 MG/DAY, Place 1 patch onto the skin 2 (two) times a week. , Disp: , Rfl:  .  levothyroxine (SYNTHROID) 75 MCG tablet, Take 1 tablet (75 mcg total) by mouth daily before breakfast., Disp: 30 tablet, Rfl: 0 .  ALPRAZolam (XANAX) 0.5 MG tablet, Take 1 tablet (0.5 mg total) by mouth at bedtime as needed for anxiety or sleep. (Patient not taking: Reported on 06/30/2020), Disp: 20 tablet, Rfl: 0 .  ibuprofen (ADVIL,MOTRIN) 200 MG tablet, Take 600 mg by mouth every 8 (eight) hours as needed. inflammation  (Patient not taking: Reported on 06/30/2020), Disp: ,  Rfl:   Current Facility-Administered Medications:  .  0.9 %  sodium chloride infusion, 500 mL, Intravenous, Continuous, Pyrtle, Lajuan Lines, MD  Allergies  Allergen Reactions  . Other     Mycins per patient.  . Penicillins      Reviewed their medical, surgical, family, social, medication, and allergy history and updated chart as appropriate.   Review of Systems Constitutional: -fever, -chills, -sweats, -unexpected weight change, -decreased appetite, -fatigue Allergy: -sneezing, -itching, -congestion Dermatology: -changing moles, --rash, -lumps ENT: -runny nose, -ear pain, -sore throat, -hoarseness, -sinus pain, -teeth pain, - ringing in ears, -hearing loss, -nosebleeds Cardiology: -chest pain, -palpitations, -swelling, -difficulty breathing when lying flat, -waking up short of  breath Respiratory: -cough, -shortness of breath, -difficulty breathing with exercise or exertion, -wheezing, -coughing up blood Gastroenterology: -abdominal pain, -nausea, -vomiting, -diarrhea, -constipation, -blood in stool, -changes in bowel movement, -difficulty swallowing or eating Hematology: -bleeding, -bruising  Musculoskeletal: -joint aches, -muscle aches, -joint swelling, -back pain, -neck pain, -cramping, -changes in gait Ophthalmology: denies vision changes, eye redness, itching, discharge Urology: -burning with urination, -difficulty urinating, -blood in urine, -urinary frequency, -urgency, -incontinence Neurology: -headache, -weakness, -tingling, -numbness, -memory loss, -falls, -dizziness Psychology: -depressed mood, -agitation, -sleep problems Breast/gyn: -breast tendnerss, -discharge, -lumps, -vaginal discharge,- irregular periods, -heavy periods     Objective:  BP 124/70   Pulse 84   Ht 5\' 5"  (1.651 m)   Wt 113 lb 9.6 oz (51.5 kg)   SpO2 98%   BMI 18.90 kg/m   General appearance: alert, no distress, WD/WN, Caucasian female Skin: unremarkable, scattered macules, no worrisome lesions HEENT: normocephalic, conjunctiva/corneas normal, sclerae anicteric, PERRLA, EOMi, nares patent, no discharge or erythema, pharynx normal Oral cavity: MMM, tongue normal, teeth normal Neck: supple, no lymphadenopathy, no thyromegaly, no masses, normal ROM, no bruits Chest: non tender, normal shape and expansion Heart: RRR, normal S1, S2, no murmurs Lungs: CTA bilaterally, no wheezes, rhonchi, or rales Abdomen: +bs, soft, non tender, non distended, no masses, no hepatomegaly, no splenomegaly, no bruits Back: non tender, normal ROM, no scoliosis Musculoskeletal: Right hand MCP with localized puffiness of the third finger, no specific tenderness, seem to have mild bony arthritic changes third finger MCP, non tender, no obvious deformity, normal ROM throughout, lower extremities non tender, no  obvious deformity, normal ROM throughout Extremities: no edema, no cyanosis, no clubbing Pulses: 2+ symmetric, upper and lower extremities, normal cap refill Neurological: alert, oriented x 3, CN2-12 intact, strength normal upper extremities and lower extremities, sensation normal throughout, DTRs 2+ throughout, no cerebellar signs, gait normal Psychiatric: normal affect, behavior normal, pleasant  Breast/gyn/rectal - deferred to gynecology    Assessment and Plan :   Encounter Diagnoses  Name Primary?  . Encounter for health maintenance examination in adult Yes  . Hypothyroidism, unspecified type   . History of atrial fibrillation   . Vaccine counseling   . Acute stress reaction   . Tubular adenoma   . Screening for lipid disorders   . Encounter for vitamin deficiency screening   . Joint swelling   . Arthralgia, unspecified joint   . Caregiver burden     Physical exam - discussed and counseled on healthy lifestyle, diet, exercise, preventative care, vaccinations, sick and well care, proper use of emergency dept and after hours care, and addressed their concerns.    Health screening: Advised they see their eye doctor yearly for routine vision care. Advised they see their dentist yearly for routine dental care including hygiene visits twice yearly. See your  gynecologist yearly for routine gynecological care.  Cancer screening Counseled on self breast exams, mammograms, cervical cancer screening  Colonoscopy:  Reviewed colonoscopy on file that is up to date  Reviewed pap on file.  Will request last mammogram from gyn   Vaccinations: Advised yearly influenza vaccine She declines flu shot  Shingles vaccine:  I recommend you have a shingles vaccine to help prevent shingles or herpes zoster outbreak.   Please call your insurer to inquire about coverage for the Shingrix vaccine given in 2 doses.   Some insurers cover this vaccine after age 53, some cover this after age 64.  If  your insurer covers this, then call to schedule appointment to have this vaccine here.  She is up to date on Covid vaccine  She will consider updating Td   Separate significant issues discussed: Hypothyroidism-labs today, continue current medication, discussed proper use of medicine  History of A. fib-reviewed prior cardiology notes from last year.  She is due back for follow-up in November  I reviewed the recent colonoscopy she had done showing tubular adenoma  Joint swelling and pain likely osteoarthritis.  Labs today for screening sed rate and uric acid  Acute stress reaction/anxiety from last visit-much improved after the family got rid of some caregivers that were looking after her mom that they felt was dishonest and undermining their authority   We will request updated copy of recent bone density test.  We discussed weightbearing exercise, calcium and vitamin D supplementation   Hailea was seen today for annual exam.  Diagnoses and all orders for this visit:  Encounter for health maintenance examination in adult -     Comprehensive metabolic panel -     CBC with Differential/Platelet -     TSH -     T4, free -     VITAMIN D 25 Hydroxy (Vit-D Deficiency, Fractures) -     LDL Cholesterol, Direct -     Uric acid -     Sedimentation rate -     POCT Urinalysis DIP (Proadvantage Device)  Hypothyroidism, unspecified type  History of atrial fibrillation  Vaccine counseling  Acute stress reaction  Tubular adenoma  Screening for lipid disorders -     LDL Cholesterol, Direct  Encounter for vitamin deficiency screening -     VITAMIN D 25 Hydroxy (Vit-D Deficiency, Fractures)  Joint swelling -     Uric acid -     Sedimentation rate  Arthralgia, unspecified joint -     Uric acid -     Sedimentation rate  Caregiver burden  Of note she was not fasting today.  Follow-up pending labs, yearly for physical

## 2020-07-01 ENCOUNTER — Other Ambulatory Visit: Payer: Self-pay | Admitting: Medical

## 2020-07-01 LAB — CBC WITH DIFFERENTIAL/PLATELET
Basophils Absolute: 0.1 10*3/uL (ref 0.0–0.2)
Basos: 1 %
EOS (ABSOLUTE): 0.1 10*3/uL (ref 0.0–0.4)
Eos: 0 %
Hematocrit: 41.7 % (ref 34.0–46.6)
Hemoglobin: 14.1 g/dL (ref 11.1–15.9)
Immature Grans (Abs): 0 10*3/uL (ref 0.0–0.1)
Immature Granulocytes: 0 %
Lymphocytes Absolute: 1.4 10*3/uL (ref 0.7–3.1)
Lymphs: 11 %
MCH: 30 pg (ref 26.6–33.0)
MCHC: 33.8 g/dL (ref 31.5–35.7)
MCV: 89 fL (ref 79–97)
Monocytes Absolute: 0.9 10*3/uL (ref 0.1–0.9)
Monocytes: 7 %
Neutrophils Absolute: 10.9 10*3/uL — ABNORMAL HIGH (ref 1.4–7.0)
Neutrophils: 81 %
Platelets: 346 10*3/uL (ref 150–450)
RBC: 4.7 x10E6/uL (ref 3.77–5.28)
RDW: 12.7 % (ref 11.7–15.4)
WBC: 13.4 10*3/uL — ABNORMAL HIGH (ref 3.4–10.8)

## 2020-07-01 LAB — COMPREHENSIVE METABOLIC PANEL
ALT: 6 IU/L (ref 0–32)
AST: 19 IU/L (ref 0–40)
Albumin/Globulin Ratio: 1.7 (ref 1.2–2.2)
Albumin: 4.3 g/dL (ref 3.8–4.8)
Alkaline Phosphatase: 69 IU/L (ref 44–121)
BUN/Creatinine Ratio: 11 — ABNORMAL LOW (ref 12–28)
BUN: 10 mg/dL (ref 8–27)
Bilirubin Total: 0.7 mg/dL (ref 0.0–1.2)
CO2: 28 mmol/L (ref 20–29)
Calcium: 11.2 mg/dL — ABNORMAL HIGH (ref 8.7–10.3)
Chloride: 98 mmol/L (ref 96–106)
Creatinine, Ser: 0.92 mg/dL (ref 0.57–1.00)
GFR calc Af Amer: 76 mL/min/{1.73_m2} (ref >59)
GFR calc non Af Amer: 66 mL/min/{1.73_m2} (ref >59)
Globulin, Total: 2.5 g/dL (ref 1.5–4.5)
Glucose: 96 mg/dL (ref 65–99)
Potassium: 3.8 mmol/L (ref 3.5–5.2)
Sodium: 141 mmol/L (ref 134–144)
Total Protein: 6.8 g/dL (ref 6.0–8.5)

## 2020-07-01 LAB — TSH: TSH: 0.435 u[IU]/mL — ABNORMAL LOW (ref 0.450–4.500)

## 2020-07-01 LAB — LDL CHOLESTEROL, DIRECT: LDL Direct: 131 mg/dL — ABNORMAL HIGH (ref 0–99)

## 2020-07-01 LAB — SEDIMENTATION RATE: Sed Rate: 8 mm/hr (ref 0–40)

## 2020-07-01 LAB — T4, FREE: Free T4: 1.63 ng/dL (ref 0.82–1.77)

## 2020-07-01 LAB — URIC ACID: Uric Acid: 5.1 mg/dL (ref 3.0–7.2)

## 2020-07-01 LAB — VITAMIN D 25 HYDROXY (VIT D DEFICIENCY, FRACTURES): Vit D, 25-Hydroxy: 80.7 ng/mL (ref 30.0–100.0)

## 2020-07-01 MED ORDER — LEVOTHYROXINE SODIUM 50 MCG PO TABS: 50.0000 ug | ORAL_TABLET | Freq: Every day | ORAL | 2 refills | Status: DC

## 2020-07-04 MED ORDER — LEVOTHYROXINE SODIUM 50 MCG PO TABS: 50.0000 ug | ORAL_TABLET | Freq: Every day | ORAL | 2 refills | Status: DC

## 2020-07-04 NOTE — Addendum Note (Signed)
Addended by: Edgar Frisk on: 07/04/2020 10:00 AM   Modules accepted: Orders

## 2020-07-14 ENCOUNTER — Other Ambulatory Visit: Payer: Self-pay | Admitting: Medical

## 2020-07-14 ENCOUNTER — Telehealth: Payer: Self-pay | Admitting: Medical

## 2020-07-14 DIAGNOSIS — M7989 Other specified soft tissue disorders: Secondary | ICD-10-CM

## 2020-07-14 DIAGNOSIS — M79641 Pain in right hand: Secondary | ICD-10-CM

## 2020-07-14 DIAGNOSIS — R7989 Other specified abnormal findings of blood chemistry: Secondary | ICD-10-CM

## 2020-07-14 DIAGNOSIS — D72829 Elevated white blood cell count, unspecified: Secondary | ICD-10-CM

## 2020-07-14 NOTE — Progress Notes (Signed)
Dg  

## 2020-07-14 NOTE — Telephone Encounter (Signed)
I put orders in system.  She could probably get these at [facility]  If she needs them on paper script, write them and I'll sign.  See where she wants these done at?

## 2020-07-14 NOTE — Telephone Encounter (Signed)
Pt called concerning two issues.  First she would like orders to go to a facility in [place] to have upcoming labs.   Also, pt continues to have issues with her hand. She is having swelling, redness and pain. She would like to have a Xray. She would like to go to [facility] so she can stay in [place].   Please advise pt at [phone].

## 2020-07-15 ENCOUNTER — Ambulatory Visit (HOSPITAL_COMMUNITY)
Admission: RE | Admit: 2020-07-15 | Discharge: 2020-07-15 | Disposition: A | Discharge status: Home / Self Care | Payer: BC Managed Care – PPO | Source: Ambulatory Visit | Attending: Medical | Admitting: Medical

## 2020-07-15 ENCOUNTER — Other Ambulatory Visit: Payer: Self-pay

## 2020-07-15 DIAGNOSIS — M7989 Other specified soft tissue disorders: Secondary | ICD-10-CM | POA: Diagnosis not present

## 2020-07-15 DIAGNOSIS — M79641 Pain in right hand: Secondary | ICD-10-CM | POA: Insufficient documentation

## 2020-07-15 NOTE — Telephone Encounter (Signed)
Patient informed. 

## 2020-08-05 ENCOUNTER — Other Ambulatory Visit: Payer: BC Managed Care – PPO

## 2020-08-05 DIAGNOSIS — R7989 Other specified abnormal findings of blood chemistry: Secondary | ICD-10-CM

## 2020-08-05 DIAGNOSIS — D72829 Elevated white blood cell count, unspecified: Secondary | ICD-10-CM | POA: Diagnosis not present

## 2020-08-06 LAB — CBC
Hematocrit: 42.9 % (ref 34.0–46.6)
Hemoglobin: 14.1 g/dL (ref 11.1–15.9)
MCH: 29.3 pg (ref 26.6–33.0)
MCHC: 32.9 g/dL (ref 31.5–35.7)
MCV: 89 fL (ref 79–97)
Platelets: 359 10*3/uL (ref 150–450)
RBC: 4.82 x10E6/uL (ref 3.77–5.28)
RDW: 12.7 % (ref 11.7–15.4)
WBC: 11.3 10*3/uL — ABNORMAL HIGH (ref 3.4–10.8)

## 2020-08-06 LAB — URINALYSIS
Bilirubin, UA: NEGATIVE
Glucose, UA: NEGATIVE
Ketones, UA: NEGATIVE
Leukocytes,UA: NEGATIVE
Nitrite, UA: NEGATIVE
RBC, UA: NEGATIVE
Specific Gravity, UA: 1.021 (ref 1.005–1.030)
Urobilinogen, Ur: 0.2 mg/dL (ref 0.2–1.0)
pH, UA: 7.5 (ref 5.0–7.5)

## 2020-08-06 LAB — TSH: TSH: 5.23 u[IU]/mL — ABNORMAL HIGH (ref 0.450–4.500)

## 2020-08-06 LAB — PTH, INTACT AND CALCIUM
Calcium: 10 mg/dL (ref 8.7–10.3)
PTH: 18 pg/mL (ref 15–65)

## 2020-08-22 ENCOUNTER — Other Ambulatory Visit: Payer: Self-pay | Admitting: Medical

## 2020-08-22 ENCOUNTER — Telehealth: Payer: Self-pay | Admitting: Medical

## 2020-08-22 MED ORDER — LEVOTHYROXINE SODIUM 75 MCG PO TABS: 75.0000 ug | ORAL_TABLET | Freq: Every day | ORAL | 0 refills | Status: DC

## 2020-08-22 NOTE — Telephone Encounter (Signed)
Please call patient.  Tell Allison Koch I said hello  Back in October her thyroid was overcorrected, suggesting the 75 mcg dose was too high.  She recently had an updated TSH which now shows under correction.  We only adjusted 25 mcg which is a very tiny dose  Remind me, how long has she been on the 75 mcg dose?  Has her thyroid been hard to control or has she been on the same dose for a long time?  I will go ahead and send the 75 mcg again since the thyroid is undercorrected for now

## 2020-08-22 NOTE — Telephone Encounter (Signed)
Message has been sent to patient via mychart  

## 2020-08-22 NOTE — Telephone Encounter (Signed)
Pt called and states that she was speaking to the pharmacist and telling her about some issues she has been having. She states that the longer she stays on the new dose of levothyroxine she seems to be having less and less energy. She states that by dinner time she is so tired she can barely move. Pharmacist recommended she call and let Audelia Acton know. Pt uses [employer] and can be reached at [phone].

## 2020-08-24 ENCOUNTER — Other Ambulatory Visit: Payer: Self-pay | Admitting: Medical

## 2020-08-24 MED ORDER — LEVOTHYROXINE SODIUM 75 MCG PO TABS: 75.0000 ug | ORAL_TABLET | Freq: Every day | ORAL | 0 refills | Status: DC

## 2020-08-29 ENCOUNTER — Telehealth: Payer: Self-pay | Admitting: Medical

## 2020-08-29 NOTE — Telephone Encounter (Signed)
Requested records received from [facility]

## 2020-09-19 ENCOUNTER — Encounter: Payer: Self-pay | Admitting: Medical

## 2020-11-18 ENCOUNTER — Encounter: Payer: Self-pay | Admitting: Cardiology

## 2020-11-18 ENCOUNTER — Ambulatory Visit (INDEPENDENT_AMBULATORY_CARE_PROVIDER_SITE_OTHER): Payer: No Typology Code available for payment source | Admitting: Cardiology

## 2020-11-18 ENCOUNTER — Other Ambulatory Visit: Payer: Self-pay

## 2020-11-18 VITALS — BP 142/76 | HR 84 | Ht 65.0 in | Wt 116.0 lb

## 2020-11-18 DIAGNOSIS — Z8679 Personal history of other diseases of the circulatory system: Secondary | ICD-10-CM | POA: Diagnosis not present

## 2020-11-18 NOTE — Progress Notes (Signed)
Cardiology Office Note  Date: 11/18/2020   ID: Allison Koch, Allison Koch 11/15/1955, MRN [id number]  PCP:  Carlena Hurl, PA-C  Cardiologist:  Rozann Lesches, MD Electrophysiologist:  None   Chief Complaint  Patient presents with  . Cardiac follow-up    History of Present Illness: Allison Koch is a 65 y.o. female last assessed via telehealth encounter in November 2020.  She is here for a routine follow-up visit.  Reports no significant palpitations since last assessment, no recurring chest pain.  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  I reviewed her medications which are noted below.  She continues to follow with PCP.  ECG today shows normal sinus rhythm.  Past Medical History:  Diagnosis Date  . Hypothyroidism   . PAF (paroxysmal atrial fibrillation) ([place])    Noted in setting of acute illness  . Tubular adenoma 07/2019   colonoscopy, Dr. Zenovia Jarred    Past Surgical History:  Procedure Laterality Date  . COLONOSCOPY  07/2019   Dr. Zenovia Jarred, tubular adenoma, repeat 5 years  . EYE SURGERY    . TONSILLECTOMY    . UTERINE FIBROID SURGERY    . WISDOM TOOTH EXTRACTION      Current Outpatient Medications  Medication Sig Dispense Refill  . Calcium Carbonate-Vitamin D 600-400 MG-UNIT per chew tablet Chew 1 tablet by mouth 2 (two) times daily.    . COMBIPATCH 0.05-0.14 MG/DAY Place 1 patch onto the skin 2 (two) times a week.     Marland Kitchen ibuprofen (ADVIL,MOTRIN) 200 MG tablet Take 600 mg by mouth every 8 (eight) hours as needed. inflammation    . levothyroxine (SYNTHROID) 75 MCG tablet Take 1 tablet (75 mcg total) by mouth daily before breakfast. 90 tablet 0   Current Facility-Administered Medications  Medication Dose Route Frequency Provider Last Rate Last Admin  . 0.9 %  sodium chloride infusion  500 mL Intravenous Continuous Pyrtle, Lajuan Lines, MD       Allergies:  Other and Penicillins   ROS: No syncope.  Physical Exam: VS:  BP  (!) 142/76   Pulse 84   Ht 5\' 5"  (1.651 m)   Wt 116 lb (52.6 kg)   SpO2 99%   BMI 19.30 kg/m , BMI Body mass index is 19.3 kg/m.  Wt Readings from Last 3 Encounters:  11/18/20 116 lb (52.6 kg)  06/30/20 113 lb 9.6 oz (51.5 kg)  07/29/19 112 lb (50.8 kg)    General: Patient appears comfortable at rest. HEENT: Conjunctiva and lids normal, wearing a mask. Neck: Supple, no elevated JVP or carotid bruits, no thyromegaly. Lungs: Clear to auscultation, nonlabored breathing at rest. Cardiac: Regular rate and rhythm, no S3 or significant systolic murmur, no pericardial rub. Extremities: No pitting edema.  ECG:  An ECG dated 07/30/2019 was personally reviewed today and demonstrated:  Sinus rhythm with RSR' in lead V1 and V2, nonspecific ST changes.  Recent Labwork: 06/30/2020: ALT 6; AST 19; BUN 10; Creatinine, Ser 0.92; Potassium 3.8; Sodium 141 08/05/2020: Hemoglobin 14.1; Platelets 359; TSH 5.230     Component Value Date/Time   LDLDIRECT 131 (H) 06/30/2020 1355    Other Studies Reviewed Today:  No interval cardiac testing for review today.  Assessment and Plan:  1. Previous history of atrial fibrillation in the setting of acute illness.  We have adopted a strategy of observation in the absence of recurrent arrhythmia.  She reports no interval palpitations and her  ECG shows normal sinus rhythm today.  2.  Hypothyroidism, on Synthroid with follow-up by Dr. Glade Lloyd.  Recent TSH 5.23.  Medication Adjustments/Labs and Tests Ordered: Current medicines are reviewed at length with the patient today.  Concerns regarding medicines are outlined above.   Tests Ordered: Orders Placed This Encounter  Procedures  . EKG 12-Lead    Medication Changes: No orders of the defined types were placed in this encounter.   Disposition:  Follow up 1 year in the [place] office.  Signed, Satira Sark, MD, [facility] 11/18/2020 1:16 PM    [facility] at [facility] [address], [place], [place] [address] Phone: [phone]; Fax: [phone]

## 2020-11-18 NOTE — Patient Instructions (Signed)
Medication Instructions:  Your physician recommends that you continue on your current medications as directed. Please refer to the Current Medication list given to you today.  *If you need a refill on your cardiac medications before your next appointment, please call your pharmacy*   Lab Work: None today If you have labs (blood work) drawn today and your tests are completely normal, you will receive your results only by: . MyChart Message (if you have MyChart) OR . A paper copy in the mail If you have any lab test that is abnormal or we need to change your treatment, we will call you to review the results.   Testing/Procedures: None today   Follow-Up: At [facility], you and your health needs are our priority.  As part of our continuing mission to provide you with exceptional heart care, we have created designated Provider Care Teams.  These Care Teams include your primary Cardiologist (physician) and Advanced Practice Providers (APPs -  Physician Assistants and Nurse Practitioners) who all work together to provide you with the care you need, when you need it.  We recommend signing up for the patient portal called "MyChart".  Sign up information is provided on this After Visit Summary.  MyChart is used to connect with patients for Virtual Visits (Telemedicine).  Patients are able to view lab/test results, encounter notes, upcoming appointments, etc.  Non-urgent messages can be sent to your provider as well.   To learn more about what you can do with MyChart, go to [email or web address].    Your next appointment:   12 month(s)  The format for your next appointment:   In Person  Provider:   Samuel McDowell, MD   Other Instructions None       Thank you for choosing [facility] !         

## 2020-12-23 ENCOUNTER — Telehealth: Payer: Self-pay

## 2020-12-23 ENCOUNTER — Other Ambulatory Visit: Payer: Self-pay

## 2020-12-23 MED ORDER — LEVOTHYROXINE SODIUM 75 MCG PO TABS
75.0000 ug | ORAL_TABLET | Freq: Every day | ORAL | 0 refills | Status: DC
Start: 2020-12-23 — End: 2021-01-10

## 2020-12-23 NOTE — Telephone Encounter (Signed)
Received fax from [facility] for a refill on the pts. Levothyroxine pt. Last apt was 06/30/20 and next apt is 01/09/21.

## 2020-12-23 NOTE — Telephone Encounter (Signed)
Done

## 2021-01-09 ENCOUNTER — Ambulatory Visit (INDEPENDENT_AMBULATORY_CARE_PROVIDER_SITE_OTHER): Payer: No Typology Code available for payment source | Admitting: Medical

## 2021-01-09 ENCOUNTER — Encounter: Payer: Self-pay | Admitting: Medical

## 2021-01-09 VITALS — BP 140/62 | HR 81 | Ht 65.0 in | Wt 116.0 lb

## 2021-01-09 DIAGNOSIS — E039 Hypothyroidism, unspecified: Secondary | ICD-10-CM | POA: Diagnosis not present

## 2021-01-09 DIAGNOSIS — D72829 Elevated white blood cell count, unspecified: Secondary | ICD-10-CM | POA: Insufficient documentation

## 2021-01-09 DIAGNOSIS — Z7185 Encounter for immunization safety counseling: Secondary | ICD-10-CM | POA: Diagnosis not present

## 2021-01-09 DIAGNOSIS — Z636 Dependent relative needing care at home: Secondary | ICD-10-CM | POA: Insufficient documentation

## 2021-01-09 DIAGNOSIS — R030 Elevated blood-pressure reading, without diagnosis of hypertension: Secondary | ICD-10-CM | POA: Insufficient documentation

## 2021-01-09 LAB — POCT URINALYSIS DIP (PROADVANTAGE DEVICE)
Blood, UA: NEGATIVE
Glucose, UA: NEGATIVE mg/dL
Leukocytes, UA: NEGATIVE
Nitrite, UA: NEGATIVE
Specific Gravity, Urine: 1.03
Urobilinogen, Ur: 0.2
pH, UA: 6 (ref 5.0–8.0)

## 2021-01-09 NOTE — Progress Notes (Signed)
Subjective: Chief Complaint  Patient presents with  . Hypothyroidism   Medical team: Dr. Matthew Saras, [facility] Physicians for [facility] Dentist Eye doctor Dr. Layne Benton, orthopedics Dr. Rozann Lesches, cardiology Dr. Zenovia Jarred, gastroenterology  Here for routine follow up on hypothyroidism.  Hypothyroidism - compliant with levothyroxine 5mcg daily.  Takes just before breakfast, not the full amount of time as recommended.  No recent concerns  Leukocytosis on prior labs - no recent fever, night sweats, weight loss, change in appetite or other concerns.    Caregiver for mother.   Mom getting a little worse.  She has dementia.  Elevated BP - doesn't check BPs at home.     Past Medical History:  Diagnosis Date  . Hypothyroidism   . PAF (paroxysmal atrial fibrillation) ([place])    Noted in setting of acute illness  . Tubular adenoma 07/2019   colonoscopy, Dr. Zenovia Jarred   Current Outpatient Medications on File Prior to Visit  Medication Sig Dispense Refill  . Calcium Carbonate-Vitamin D 600-400 MG-UNIT per chew tablet Chew 1 tablet by mouth 2 (two) times daily.    . COMBIPATCH 0.05-0.14 MG/DAY Place 1 patch onto the skin 2 (two) times a week.     Marland Kitchen ibuprofen (ADVIL,MOTRIN) 200 MG tablet Take 600 mg by mouth every 8 (eight) hours as needed. inflammation    . levothyroxine (SYNTHROID) 75 MCG tablet Take 1 tablet (75 mcg total) by mouth daily before breakfast. 90 tablet 0   Current Facility-Administered Medications on File Prior to Visit  Medication Dose Route Frequency Provider Last Rate Last Admin  . 0.9 %  sodium chloride infusion  500 mL Intravenous Continuous Pyrtle, Lajuan Lines, MD       ROS as in subjective    Objective: BP 140/62   Pulse 81   Ht 5\' 5"  (1.651 m)   Wt 116 lb (52.6 kg)   SpO2 97%   BMI 19.30 kg/m   Wt Readings from Last 3 Encounters:  01/09/21 116 lb (52.6 kg)  11/18/20 116 lb (52.6 kg)  06/30/20 113 lb 9.6 oz (51.5 kg)   BP Readings from Last 3 Encounters:   01/09/21 140/62  11/18/20 (!) 142/76  06/30/20 124/70    General appearence: alert, no distress, WD/WN,  Neck: supple, no lymphadenopathy, no thyromegaly, no masses Heart: RRR, normal S1, S2, no murmurs Lungs: CTA bilaterally, no wheezes, rhonchi, or rales Pulses: 2+ symmetric, upper and lower extremities, normal cap refill Ext: no edema     Assessment: Encounter Diagnoses  Name Primary?  . Hypothyroidism, unspecified type Yes  . Leukocytosis, unspecified type   . Vaccine counseling   . Caregiver stress   . Elevated blood-pressure reading without diagnosis of hypertension      Plan: hypothyroidism - labs today.   Currently on 61mcg dose daily.   Discussed proper use of medicaitons  Leukocytosis - discussed prior lab findings . Discussed possible additional evaluation if white cells still elevated   Vaccines: Vaccine recommendations for people 65 years and older include Shingrix shingles vaccine and pneumococcal vaccine  Pneumonia vaccine:  I recommend you have a Pneumonia/Prevnar 13 vaccine to help prevent 13 of the more than 100 strains of bacterial pneumonia.   Please call your insurer to inquire about coverage for the Prevnar 13 vaccine.   If your insurer covers this, then call to schedule appointment to have this vaccine here.  Shingles vaccine:  I recommend you have a shingles vaccine to help prevent shingles or herpes zoster outbreak.  Please call your insurer to inquire about coverage for the Shingrix vaccine given in 2 doses.   Some insurers cover this vaccine after age 70, some cover this after age 65  If your insurer covers this, then call to schedule appointment to have this vaccine here.  A tetanus booster is recommended every 10 years.  Check insurance coverage for this as well.   Blood pressure Your blood pressures have been a little elevated of prior visits  Goal is 120/70.  Borderline is 130/80, and high blood pressure is 140/90 or higher.   Check  blood pressure 2-3 days per week over the next few weeks.  If home readings are staying elevated, we may need to add medication.  Marnesha was seen today for hypothyroidism.  Diagnoses and all orders for this visit:  Hypothyroidism, unspecified type -     TSH + free T4 -     POCT Urinalysis DIP (Proadvantage Device)  Leukocytosis, unspecified type -     CBC with Differential/Platelet -     POCT Urinalysis DIP (Proadvantage Device)  Vaccine counseling  Caregiver stress  Elevated blood-pressure reading without diagnosis of hypertension   F/u pending labs

## 2021-01-09 NOTE — Patient Instructions (Addendum)
hypothyroidism - labs today.   Currently on 39mcg dose daily.   Discussed proper use of medicaitons  Leukocytosis - discussed prior lab findings . Discussed possible additional evaluation if white cells still elevated   Vaccines: Vaccine recommendations for people 50 years and older include Shingrix shingles vaccine and pneumococcal vaccine  Pneumonia vaccine:  I recommend you have a Pneumonia/Prevnar 13 vaccine to help prevent 13 of the more than 100 strains of bacterial pneumonia.   Please call your insurer to inquire about coverage for the Prevnar 13 vaccine.   If your insurer covers this, then call to schedule appointment to have this vaccine here.  Shingles vaccine:  I recommend you have a shingles vaccine to help prevent shingles or herpes zoster outbreak.   Please call your insurer to inquire about coverage for the Shingrix vaccine given in 2 doses.   Some insurers cover this vaccine after age 62, some cover this after age 60.  If your insurer covers this, then call to schedule appointment to have this vaccine here.  A tetanus booster is recommended every 10 years.  Check insurance coverage for this as well.    Blood pressure Your blood pressures have been a little elevated of prior visits  Goal is 120/70.  Borderline is 130/80, and high blood pressure is 140/90 or higher.   Check blood pressure 2-3 days per week over the next few weeks.  If home readings are staying elevated, we may need to add medication.

## 2021-01-10 ENCOUNTER — Other Ambulatory Visit: Payer: Self-pay | Admitting: Medical

## 2021-01-10 LAB — CBC WITH DIFFERENTIAL/PLATELET
Basophils Absolute: 0.1 10*3/uL (ref 0.0–0.2)
Basos: 1 %
EOS (ABSOLUTE): 0.1 10*3/uL (ref 0.0–0.4)
Eos: 1 %
Hematocrit: 41.7 % (ref 34.0–46.6)
Hemoglobin: 13.4 g/dL (ref 11.1–15.9)
Immature Grans (Abs): 0 10*3/uL (ref 0.0–0.1)
Immature Granulocytes: 0 %
Lymphocytes Absolute: 1.9 10*3/uL (ref 0.7–3.1)
Lymphs: 18 %
MCH: 29 pg (ref 26.6–33.0)
MCHC: 32.1 g/dL (ref 31.5–35.7)
MCV: 90 fL (ref 79–97)
Monocytes Absolute: 0.9 10*3/uL (ref 0.1–0.9)
Monocytes: 8 %
Neutrophils Absolute: 7.8 10*3/uL — ABNORMAL HIGH (ref 1.4–7.0)
Neutrophils: 72 %
Platelets: 398 10*3/uL (ref 150–450)
RBC: 4.62 x10E6/uL (ref 3.77–5.28)
RDW: 12.6 % (ref 11.7–15.4)
WBC: 10.8 10*3/uL (ref 3.4–10.8)

## 2021-01-10 LAB — TSH+FREE T4
Free T4: 1.49 ng/dL (ref 0.82–1.77)
TSH: 1.73 u[IU]/mL (ref 0.450–4.500)

## 2021-01-10 MED ORDER — LEVOTHYROXINE SODIUM 75 MCG PO TABS: 75.0000 ug | ORAL_TABLET | Freq: Every day | ORAL | 1 refills | Status: DC

## 2021-01-27 ENCOUNTER — Other Ambulatory Visit: Payer: Medicare Other

## 2021-01-27 ENCOUNTER — Telehealth: Payer: Self-pay | Admitting: Medical

## 2021-01-27 NOTE — Telephone Encounter (Signed)
Please put in a nurse visit order and document a blood pressure reading of 124/67.  That way we get credit for the blood pressure reading under the metrics  Please call her and thank her for sending me blood pressure readings which all look fine.

## 2021-01-27 NOTE — Telephone Encounter (Signed)
Done

## 2021-01-27 NOTE — Progress Notes (Signed)
Pt having given an update on her blood pressure reading.

## 2021-02-02 ENCOUNTER — Encounter: Payer: Self-pay | Admitting: Medical

## 2021-02-21 ENCOUNTER — Other Ambulatory Visit: Payer: Self-pay

## 2021-02-21 ENCOUNTER — Encounter: Payer: Self-pay | Admitting: Medical

## 2021-02-21 ENCOUNTER — Ambulatory Visit (INDEPENDENT_AMBULATORY_CARE_PROVIDER_SITE_OTHER): Payer: Medicare Other | Admitting: Medical

## 2021-02-21 VITALS — BP 152/68 | HR 90 | Temp 98.9°F | Ht 65.0 in | Wt 115.2 lb

## 2021-02-21 DIAGNOSIS — M25461 Effusion, right knee: Secondary | ICD-10-CM

## 2021-02-21 DIAGNOSIS — R5383 Other fatigue: Secondary | ICD-10-CM | POA: Insufficient documentation

## 2021-02-21 DIAGNOSIS — M25561 Pain in right knee: Secondary | ICD-10-CM

## 2021-02-21 DIAGNOSIS — M2550 Pain in unspecified joint: Secondary | ICD-10-CM

## 2021-02-21 DIAGNOSIS — R531 Weakness: Secondary | ICD-10-CM | POA: Insufficient documentation

## 2021-02-21 DIAGNOSIS — R509 Fever, unspecified: Secondary | ICD-10-CM | POA: Diagnosis not present

## 2021-02-21 DIAGNOSIS — S30861A Insect bite (nonvenomous) of abdominal wall, initial encounter: Secondary | ICD-10-CM

## 2021-02-21 DIAGNOSIS — Z8616 Personal history of COVID-19: Secondary | ICD-10-CM

## 2021-02-21 DIAGNOSIS — W57XXXA Bitten or stung by nonvenomous insect and other nonvenomous arthropods, initial encounter: Secondary | ICD-10-CM | POA: Insufficient documentation

## 2021-02-21 MED ORDER — EMERGEN-C IMMUNE PLUS PO PACK: 1.0000 | PACK | Freq: Two times a day (BID) | ORAL | 0 refills | Status: DC

## 2021-02-21 MED ORDER — NAPROXEN 500 MG PO TABS: 500.0000 mg | ORAL_TABLET | Freq: Two times a day (BID) | ORAL | 0 refills | Status: DC

## 2021-02-21 NOTE — Progress Notes (Signed)
Subjective:  Allison Koch is a 65 y.o. female who presents for Chief Complaint  Patient presents with  . Weakness    Pt experiencing weakness after tick bite. Had tick bite 6/3 with lower back pain. Weakness started yesterday. Knees are aching and swollen.      Here for c/o weakness.  May 15 had a migraine, didn't feel well.  Doesn't get headaches often, but this was a horrible headache . Had some spots in vision.  Over time it improved.  The next day headache better, but felt fatigued, started getting dry cough.  Went and got tested and was positive for Covid 5/16.  Never had sore throat, sneezing, congestion.  Did have some fever, dry hacking cough.  The next day fever was up to 100.3, the highest fever got.   On 5/25 tested negative for covid.   Still feeling tired, mental fog, slight lingering cough, some hoarseness.   June 3 found a lone star tick bitten in on lower abdomen.  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      She notes hx/o tick bite and alpha gal a few years ago.     June 4th felt really tired.  June 5th continued to have fatigue, tired, but also had some back pain that was quite bad for several days.   100.2 fever few days ago.  Yesterday felt the worse she has felt even since 5/15.  Yesterday has a pop sound in left hip region, pain kept her awake.  Continued to feel faint, weak.  Felt lightheaded and nauseated.  Spent the day on the couch yesterday.   Knees have been achy.   Right knee felt a little swollen.    No other aggravating or relieving factors.    No other c/o.  Past Medical History:  Diagnosis Date  . Hypothyroidism   . PAF (paroxysmal atrial fibrillation) ([place])    Noted in setting of acute illness  . Tubular adenoma 07/2019   colonoscopy, Dr. Zenovia Jarred   Current Outpatient Medications on File Prior to Visit  Medication Sig Dispense Refill  . Calcium Carbonate-Vitamin D 600-400 MG-UNIT per  chew tablet Chew 1 tablet by mouth 2 (two) times daily.    . COMBIPATCH 0.05-0.14 MG/DAY Place 1 patch onto the skin 2 (two) times a week.     Marland Kitchen ibuprofen (ADVIL,MOTRIN) 200 MG tablet Take 600 mg by mouth every 8 (eight) hours as needed. inflammation    . levothyroxine (SYNTHROID) 75 MCG tablet Take 1 tablet (75 mcg total) by mouth daily before breakfast. 90 tablet 1   Current Facility-Administered Medications on File Prior to Visit  Medication Dose Route Frequency Provider Last Rate Last Admin  . 0.9 %  sodium chloride infusion  500 mL Intravenous Continuous Pyrtle, Lajuan Lines, MD         The following portions of the patient's history were reviewed and updated as appropriate: allergies, current medications, past family history, past medical history, past social history, past surgical history and problem list.  ROS Otherwise as in subjective above    Objective: BP (!) 152/68   Pulse 90   Ht 5\' 5"  (1.651 m)   Wt 115 lb 3.2 oz (52.3 kg)   SpO2 98%   BMI 19.17 kg/m   General appearance: alert, no distress, well developed, well nourished, somewhat fatigued appearing HEENT: normocephalic, sclerae anicteric, conjunctiva pink and moist, TMs  pearly, nares patent, no discharge or erythema, pharynx normal Oral: moist, no lesions Neck: supple, no lymphadenopathy, no thyromegaly, no masses Heart: RRR, normal S1, S2, no murmurs Lungs: CTA bilaterally, no wheezes, rhonchi, or rales Pulses: 2+ radial pulses, 2+ pedal pulses, normal cap refill Ext: no edema Skin: small 49mm round area of erythema on left lower abdomen from recent tick bite MSK: right knee with mild generalized swelling, unable to fully extend, extension to about 170 degrees, otherwise arms and legs nontender, no other swelling, normal ROM otherwise     Assessment: Encounter Diagnoses  Name Primary?  . Pain and swelling of right knee Yes  . Polyarthralgia   . Tick bite of abdominal wall, initial encounter   . Fever,  unspecified fever cause   . History of COVID-19   . Fatigue, unspecified type   . Weakness      Plan: Discussed symptoms, possible causes.  She has had recent covid and tick bite.  She has right knee swelling and pain despite injury.  She may have some arthritis given history of similar pain and swelling in the past.  She notes prior negative testing or gout.  We discussed long term fatigue with covid.   Rest, hydrate well with water and clear fluids.  Begin medicaiton below.    Discussed if knee worse redness, warmth, pain, or reduced ROM in the next few days, recheck immediately.    Terree was seen today for weakness.  Diagnoses and all orders for this visit:  Pain and swelling of right knee  Polyarthralgia -     B. Burgdorfi Antibodies  Tick bite of abdominal wall, initial encounter -     CBC with Differential/Platelet -     Sedimentation rate -     Cancel: Lyme Disease, IgM, Early Test w/ Rflx -     B. Burgdorfi Antibodies  Fever, unspecified fever cause -     CBC with Differential/Platelet -     Basic metabolic panel -     Sedimentation rate -     Cancel: Lyme Disease, IgM, Early Test w/ Rflx  History of COVID-19  Fatigue, unspecified type -     CBC with Differential/Platelet -     Basic metabolic panel -     Sedimentation rate -     Cancel: Lyme Disease, IgM, Early Test w/ Rflx  Weakness -     CBC with Differential/Platelet -     Basic metabolic panel -     Sedimentation rate -     Cancel: Lyme Disease, IgM, Early Test w/ Rflx  Other orders -     Discontinue: Multiple Vitamins-Minerals (EMERGEN-C IMMUNE PLUS) PACK; Take 1 tablet by mouth 2 (two) times daily. -     Discontinue: naproxen (NAPROSYN) 500 MG tablet; Take 1 tablet (500 mg total) by mouth 2 (two) times daily with a meal. -     naproxen (NAPROSYN) 500 MG tablet; Take 1 tablet (500 mg total) by mouth 2 (two) times daily with a meal. -     Multiple Vitamins-Minerals (EMERGEN-C IMMUNE PLUS) PACK; Take 1  tablet by mouth 2 (two) times daily.    Follow up: pending labs

## 2021-02-22 ENCOUNTER — Other Ambulatory Visit: Payer: Self-pay | Admitting: Medical

## 2021-02-22 LAB — BASIC METABOLIC PANEL
BUN/Creatinine Ratio: 15 (ref 12–28)
BUN: 12 mg/dL (ref 8–27)
CO2: 19 mmol/L — ABNORMAL LOW (ref 20–29)
Calcium: 10.2 mg/dL (ref 8.7–10.3)
Chloride: 100 mmol/L (ref 96–106)
Creatinine, Ser: 0.81 mg/dL (ref 0.57–1.00)
Glucose: 114 mg/dL — ABNORMAL HIGH (ref 65–99)
Potassium: 3.5 mmol/L (ref 3.5–5.2)
Sodium: 141 mmol/L (ref 134–144)
eGFR: 81 mL/min/{1.73_m2} (ref >59)

## 2021-02-22 LAB — CBC WITH DIFFERENTIAL/PLATELET
Basophils Absolute: 0.1 10*3/uL (ref 0.0–0.2)
Basos: 0 %
EOS (ABSOLUTE): 0 10*3/uL (ref 0.0–0.4)
Eos: 0 %
Hematocrit: 40.9 % (ref 34.0–46.6)
Hemoglobin: 13.7 g/dL (ref 11.1–15.9)
Immature Grans (Abs): 0.1 10*3/uL (ref 0.0–0.1)
Immature Granulocytes: 1 %
Lymphocytes Absolute: 1.1 10*3/uL (ref 0.7–3.1)
Lymphs: 6 %
MCH: 29.3 pg (ref 26.6–33.0)
MCHC: 33.5 g/dL (ref 31.5–35.7)
MCV: 87 fL (ref 79–97)
Monocytes Absolute: 1.6 10*3/uL — ABNORMAL HIGH (ref 0.1–0.9)
Monocytes: 9 %
Neutrophils Absolute: 16.3 10*3/uL — ABNORMAL HIGH (ref 1.4–7.0)
Neutrophils: 84 %
Platelets: 368 10*3/uL (ref 150–450)
RBC: 4.68 x10E6/uL (ref 3.77–5.28)
RDW: 12.6 % (ref 11.7–15.4)
WBC: 19.2 10*3/uL — ABNORMAL HIGH (ref 3.4–10.8)

## 2021-02-22 LAB — LYME DISEASE SEROLOGY W/REFLEX: Lyme Total Antibody EIA: NEGATIVE

## 2021-02-22 LAB — SEDIMENTATION RATE: Sed Rate: 40 mm/hr (ref 0–40)

## 2021-02-22 MED ORDER — DOXYCYCLINE MONOHYDRATE 100 MG PO TABS: 100.0000 mg | ORAL_TABLET | Freq: Two times a day (BID) | ORAL | 0 refills | Status: DC

## 2021-04-05 LAB — HM MAMMOGRAPHY

## 2021-07-27 ENCOUNTER — Telehealth: Payer: Self-pay | Admitting: Internal Medicine

## 2021-07-27 NOTE — Telephone Encounter (Signed)
Per 12/2020 visit Shane recommend you have a Pneumonia/Prevnar 13 vaccine to help prevent 13 of the more than 100 strains of bacterial pneumonia.   Please call your insurer to inquire about coverage for the Prevnar 13 vaccine.   If your insurer covers this, then call to schedule appointment to have this vaccine here.   She is going to call her insurance company and call us back to schedule if covered

## 2021-07-31 ENCOUNTER — Encounter: Payer: Self-pay | Admitting: Internal Medicine

## 2021-09-22 ENCOUNTER — Telehealth: Payer: Self-pay

## 2021-09-22 MED ORDER — LEVOTHYROXINE SODIUM 75 MCG PO TABS: 75.0000 ug | ORAL_TABLET | Freq: Every day | ORAL | 0 refills | Status: DC

## 2021-09-22 NOTE — Telephone Encounter (Signed)
Received fax from [facility] for levothyroxine last apt was 02/21/21.

## 2021-09-22 NOTE — Telephone Encounter (Signed)
done

## 2021-09-28 ENCOUNTER — Encounter: Payer: Self-pay | Admitting: Internal Medicine

## 2021-11-22 ENCOUNTER — Telehealth: Payer: Self-pay | Admitting: Medical

## 2021-11-22 NOTE — Telephone Encounter (Signed)
Spoke with patient to schedule Medicare Annual Wellness Visit (AWV) either virtually or in office. ? ?She stated she was not home and would call back to schedule  ? ?AWVI PER PALMETTO 11/15/2021 ?please schedule at anytime with health coach ? ?This should be a 45 minute visit.   ?

## 2021-12-20 ENCOUNTER — Other Ambulatory Visit: Payer: Self-pay | Admitting: Medical

## 2022-01-01 ENCOUNTER — Telehealth: Payer: Self-pay | Admitting: Medical

## 2022-01-01 NOTE — Telephone Encounter (Signed)
Spoke with patient to schedule Medicare Annual Wellness Visit (AWV) either virtually or in office. ?I left my number for patient to call [phone]. ? ?She stated she would call me back not at home right now ? ?AWVI PER PALMETTO 11/15/2021 ?please schedule at anytime with health coach ? ?This should be a 45 minute visit.   ?

## 2022-01-05 ENCOUNTER — Ambulatory Visit (INDEPENDENT_AMBULATORY_CARE_PROVIDER_SITE_OTHER): Payer: Medicare Other

## 2022-01-05 VITALS — Ht 65.0 in | Wt 110.0 lb

## 2022-01-05 DIAGNOSIS — Z0000 Encounter for general adult medical examination without abnormal findings: Secondary | ICD-10-CM

## 2022-01-05 NOTE — Progress Notes (Signed)
?I connected with Allison Koch today by telephone and verified that I am speaking with the correct person using two identifiers. ?Location patient: home ?Location provider: work ?Persons participating in the virtual visit: Nylah Butkus, Glenna Durand LPN. ?  ?I discussed the limitations, risks, security and privacy concerns of performing an evaluation and management service by telephone and the availability of in person appointments. I also discussed with the patient that there may be a patient responsible charge related to this service. The patient expressed understanding and verbally consented to this telephonic visit.  ?  ?Interactive audio and video telecommunications were attempted between this provider and patient, however failed, due to patient having technical difficulties OR patient did not have access to video capability.  We continued and completed visit with audio only. ? ?  ? ?Vital signs may be patient reported or missing. ? ?Subjective:  ? Allison Koch is a 66 y.o. female who presents for an Initial Medicare Annual Wellness Visit. ? ?Review of Systems    ? ?Cardiac Risk Factors include: advanced age (>61mn, >>48women) ? ?   ?Objective:  ?  ?Today's Vitals  ? 01/05/22 1456  ?Weight: 110 lb (49.9 kg)  ?Height: '5\' 5"'$  (1.651 m)  ? ?Body mass index is 18.3 kg/m?. ? ? ?  01/05/2022  ?  3:02 PM 08/23/2011  ?  9:33 AM 08/22/2011  ? 12:30 PM  ?Advanced Directives  ?Does Patient Have a Medical Advance Directive? Yes Patient does not have advance directive;Patient would not like information Patient does not have advance directive;Patient would not like information  ?Type of Advance Directive Healthcare Power of Attorney    ?Copy of [place] Chart? No - copy requested    ?Pre-existing out of facility DNR order (yellow form or pink MOST form)  No No  ? ? ?Current Medications (verified) ?Outpatient Encounter Medications as of 01/05/2022  ?Medication Sig  ? Calcium Carbonate-Vitamin D 600-400  MG-UNIT per chew tablet Chew 1 tablet by mouth 2 (two) times daily.  ? estradiol (VIVELLE-DOT) 0.05 MG/24HR patch estradiol 0.05 mg/24 hr semiweekly transdermal patch ? APPLY ONE PATCH ON THE SKIN TWICE A WEEK  ? ibuprofen (ADVIL,MOTRIN) 200 MG tablet Take 600 mg by mouth every 8 (eight) hours as needed. inflammation  ? levothyroxine (SYNTHROID) 75 MCG tablet TAKE 1 TABLET BY MOUTH EVERY DAY BEFORE BREAKFAST  ? progesterone (PROMETRIUM) 100 MG capsule Take 100 mg by mouth at bedtime.  ? COMBIPATCH 0.05-0.14 MG/DAY Place 1 patch onto the skin 2 (two) times a week.  (Patient not taking: Reported on 01/05/2022)  ? doxycycline (ADOXA) 100 MG tablet Take 1 tablet (100 mg total) by mouth 2 (two) times daily. (Patient not taking: Reported on 01/05/2022)  ? Multiple Vitamins-Minerals (EMERGEN-C IMMUNE PLUS) PACK Take 1 tablet by mouth 2 (two) times daily. (Patient not taking: Reported on 01/05/2022)  ? naproxen (NAPROSYN) 500 MG tablet Take 1 tablet (500 mg total) by mouth 2 (two) times daily with a meal. (Patient not taking: Reported on 01/05/2022)  ? ?Facility-Administered Encounter Medications as of 01/05/2022  ?Medication  ? 0.9 %  sodium chloride infusion  ? ? ?Allergies (verified) ?Other, Penicillins, and Sulfa antibiotics  ? ?History: ?Past Medical History:  ?Diagnosis Date  ? Hypothyroidism   [place]   ? Noted in setting of acute illness  ? Tubular adenoma 07/2019  ? colonoscopy, Dr. JUlice DashPyrtle  ? ?Past Surgical History:  ?Procedure Laterality Date  ?  COLONOSCOPY  07/2019  ? Dr. Zenovia Jarred, tubular adenoma, repeat 5 years  ? EYE SURGERY    ? TONSILLECTOMY    ? UTERINE FIBROID SURGERY    ? WISDOM TOOTH EXTRACTION    ? ?Family History  ?Problem Relation Age of Onset  ? Arrhythmia Mother   ?     atrial fib  ? Colon cancer Mother 69  ? Dementia Mother   ? COPD Mother   ? Cancer Mother   ? Heart attack Father   ?     CABG x9, maybe in 62s  ? Heart failure Father   ?     died of CHF  ?  Diabetes Father   ? Esophageal cancer Neg Hx   ? Rectal cancer Neg Hx   ? Stomach cancer Neg Hx   ? Heart disease Neg Hx   ? ?Social History  ? ?Socioeconomic History  ? Marital status: Married  ?  Spouse name: Not on file  ? Number of children: Not on file  ? Years of education: Not on file  ? Highest education level: Not on file  ?Occupational History  ? Not on file  ?Tobacco Use  ? Smoking status: Never  ? Smokeless tobacco: Never  ?Vaping Use  ? Vaping Use: Never used  ?Substance and Sexual Activity  ? Alcohol use: No  ? Drug use: No  ? Sexual activity: Not on file  ?Other Topics Concern  ? Not on file  ?Social History Narrative  ? 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 ?Social Determinants of Health  ? ?Financial Resource Strain: Low Risk   ? Difficulty of Paying Living Expenses: Not hard at all  ?Food Insecurity: No Food Insecurity  ? Worried About Charity fundraiser in the Last Year: Never true  ? Ran Out of Food in the Last Year: Never true  ?Transportation Needs: No Transportation Needs  ? Lack of Transportation (Medical): No  ? Lack of Transportation (Non-Medical): No  ?Physical Activity: Insufficiently Active  ? Days of Exercise per Week: 7 days  ? Minutes of Exercise per Session: 20 min  ?Stress: No Stress Concern Present  ? Feeling of Stress : Not at all  ?Social Connections: Not on file  ? ? ?Tobacco Counseling ?Counseling given: Not Answered ? ? ?Clinical Intake: ? ?Pre-visit preparation completed: Yes ? ?Pain : No/denies pain ? ?  ? ?Nutritional Status: BMI <19  Underweight ?Nutritional Risks: None ?Diabetes: No ? ?How often do you need to have someone help you when you read instructions, pamphlets, or other written materials from your doctor or pharmacy?: 1 - Never ?What is the last grade level you completed in school?: some college ? ?Diabetic? no ? ?Interpreter Needed?: No ? ?Information entered by :: NAllen LPN ? ? ?Activities of Daily Living ? ?  01/05/2022  ?  3:04 PM 01/09/2021  ?   2:10 PM  ?In your present state of health, do you have any difficulty performing the following activities:  ?Hearing? 0 0  ?Vision? 0 0  ?Difficulty concentrating or making decisions? 0 0  ?Walking or climbing stairs? 0 0  ?Dressing or bathing? 0 0  ?Doing errands, shopping? 0 0  ?Preparing Food and eating ? N   ?Using the Toilet? N   ?In the past six months, have you accidently leaked urine? N   ?Do you have problems with loss of bowel control? N   ?Managing your Medications? N   ?Managing  your Finances? N   ?Housekeeping or managing your Housekeeping? N   ? ? ?Patient Care Team: ?Tysinger, Leward Quan as PCP - General (Family Medicine) ?Satira Sark, MD as PCP - Cardiology (Cardiology) ?Satira Sark, MD as Consulting Physician (Cardiology) ? ?Indicate any recent Medical Services you may have received from other than [facility] providers in the past year (date may be approximate). ? ?   ?Assessment:  ? This is a routine wellness examination for [employer]. ? ?Hearing/Vision screen ?Vision Screening - Comments:: Regular eye exams, [facility] ? ?Dietary issues and exercise activities discussed: ?Current Exercise Habits: Home exercise routine, Type of exercise: walking, Time (Minutes): 20, Frequency (Times/Week): 7, Weekly Exercise (Minutes/Week): 140 ? ? Goals Addressed   ? ?  ?  ?  ?  ? This Visit's Progress  ?  Patient Stated     ?  01/05/2022, stay healthy and drink more water ?  ? ?  ? ?Depression Screen ? ?  01/05/2022  ?  3:04 PM 06/30/2020  ? 12:05 PM  ?PHQ 2/9 Scores  ?PHQ - 2 Score 0 0  ?PHQ- 9 Score 0 0  ?  ?Fall Risk ? ?  01/05/2022  ?  3:03 PM 01/09/2021  ?  2:10 PM  ?Fall Risk   ?Falls in the past year? 0 0  ?Number falls in past yr: 0 0  ?Injury with Fall? 0 0  ?Risk for fall due to : Medication side effect No Fall Risks  ?Follow up Falls evaluation completed;Education provided;Falls prevention discussed Falls evaluation completed  ? ? ?FALL RISK PREVENTION PERTAINING TO THE HOME: ? ?Any stairs in or  around the home? Yes  ?If so, are there any without handrails? No  ?Home free of loose throw rugs in walkways, pet beds, electrical cords, etc? Yes  ?Adequate lighting in your home to reduce risk of falls? Yes

## 2022-01-05 NOTE — Patient Instructions (Signed)
Allison Koch , ?Thank you for taking time to come for your Medicare Wellness Visit. I appreciate your ongoing commitment to your health goals. Please review the following plan we discussed and let me know if I can assist you in the future.  ? ?Screening recommendations/referrals: ?Colonoscopy: completed 07/22/2019, due 07/21/2024 ?Mammogram: completed 04/05/2021, due 04/06/2022 ?Bone Density: completed 03/31/2020 ?Recommended yearly ophthalmology/optometry visit for glaucoma screening and checkup ?Recommended yearly dental visit for hygiene and checkup ? ?Vaccinations: ?Influenza vaccine: decline ?Pneumococcal vaccine: decline ?Tdap vaccine: due ?Shingles vaccine: discussed   ?Covid-19: 04/16/2020, 03/24/2020 ? ?Advanced directives: Please bring a copy of your POA (Power of Attorney) and/or Living Will to your next appointment.  ? ?Conditions/risks identified: none ? ?Next appointment: Follow up in one year for your annual wellness visit  ? ? ?Preventive Care 15 Years and Older, Female ?Preventive care refers to lifestyle choices and visits with your health care provider that can promote health and wellness. ?What does preventive care include? ?A yearly physical exam. This is also called an annual well check. ?Dental exams once or twice a year. ?Routine eye exams. Ask your health care provider how often you should have your eyes checked. ?Personal lifestyle choices, including: ?Daily care of your teeth and gums. ?Regular physical activity. ?Eating a healthy diet. ?Avoiding tobacco and drug use. ?Limiting alcohol use. ?Practicing safe sex. ?Taking low-dose aspirin every day. ?Taking vitamin and mineral supplements as recommended by your health care provider. ?What happens during an annual well check? ?The services and screenings done by your health care provider during your annual well check will depend on your age, overall health, lifestyle risk factors, and family history of disease. ?Counseling  ?Your health care provider  may ask you questions about your: ?Alcohol use. ?Tobacco use. ?Drug use. ?Emotional well-being. ?Home and relationship well-being. ?Sexual activity. ?Eating habits. ?History of falls. ?Memory and ability to understand (cognition). ?Work and work Statistician. ?Reproductive health. ?Screening  ?You may have the following tests or measurements: ?Height, weight, and BMI. ?Blood pressure. ?Lipid and cholesterol levels. These may be checked every 5 years, or more frequently if you are over 15 years old. ?Skin check. ?Lung cancer screening. You may have this screening every year starting at age 80 if you have a 30-pack-year history of smoking and currently smoke or have quit within the past 15 years. ?Fecal occult blood test (FOBT) of the stool. You may have this test every year starting at age 63. ?Flexible sigmoidoscopy or colonoscopy. You may have a sigmoidoscopy every 5 years or a colonoscopy every 10 years starting at age 31. ?Hepatitis C blood test. ?Hepatitis B blood test. ?Sexually transmitted disease (STD) testing. ?Diabetes screening. This is done by checking your blood sugar (glucose) after you have not eaten for a while (fasting). You may have this done every 1-3 years. ?Bone density scan. This is done to screen for osteoporosis. You may have this done starting at age 64. ?Mammogram. This may be done every 1-2 years. Talk to your health care provider about how often you should have regular mammograms. ?Talk with your health care provider about your test results, treatment options, and if necessary, the need for more tests. ?Vaccines  ?Your health care provider may recommend certain vaccines, such as: ?Influenza vaccine. This is recommended every year. ?Tetanus, diphtheria, and acellular pertussis (Tdap, Td) vaccine. You may need a Td booster every 10 years. ?Zoster vaccine. You may need this after age 34. ?Pneumococcal 13-valent conjugate (PCV13) vaccine. One dose is recommended  after age 23. ?Pneumococcal  polysaccharide (PPSV23) vaccine. One dose is recommended after age 44. ?Talk to your health care provider about which screenings and vaccines you need and how often you need them. ?This information is not intended to replace advice given to you by your health care provider. Make sure you discuss any questions you have with your health care provider. ?Document Released: 09/30/2015 Document Revised: 05/23/2016 Document Reviewed: 07/05/2015 ?Elsevier Interactive Patient Education ? 2017 [employer]. ? ?Fall Prevention in the Home ?Falls can cause injuries. They can happen to people of all ages. There are many things you can do to make your home safe and to help prevent falls. ?What can I do on the outside of my home? ?Regularly fix the edges of walkways and driveways and fix any cracks. ?Remove anything that might make you trip as you walk through a door, such as a raised step or threshold. ?Trim any bushes or trees on the path to your home. ?Use bright outdoor lighting. ?Clear any walking paths of anything that might make someone trip, such as rocks or tools. ?Regularly check to see if handrails are loose or broken. Make sure that both sides of any steps have handrails. ?Any raised decks and porches should have guardrails on the edges. ?Have any leaves, snow, or ice cleared regularly. ?Use sand or salt on walking paths during winter. ?Clean up any spills in your garage right away. This includes oil or grease spills. ?What can I do in the bathroom? ?Use night lights. ?Install grab bars by the toilet and in the tub and shower. Do not use towel bars as grab bars. ?Use non-skid mats or decals in the tub or shower. ?If you need to sit down in the shower, use a plastic, non-slip stool. ?Keep the floor dry. Clean up any water that spills on the floor as soon as it happens. ?Remove soap buildup in the tub or shower regularly. ?Attach bath mats securely with double-sided non-slip rug tape. ?Do not have throw rugs and other  things on the floor that can make you trip. ?What can I do in the bedroom? ?Use night lights. ?Make sure that you have a light by your bed that is easy to reach. ?Do not use any sheets or blankets that are too big for your bed. They should not hang down onto the floor. ?Have a firm chair that has side arms. You can use this for support while you get dressed. ?Do not have throw rugs and other things on the floor that can make you trip. ?What can I do in the kitchen? ?Clean up any spills right away. ?Avoid walking on wet floors. ?Keep items that you use a lot in easy-to-reach places. ?If you need to reach something above you, use a strong step stool that has a grab bar. ?Keep electrical cords out of the way. ?Do not use floor polish or wax that makes floors slippery. If you must use wax, use non-skid floor wax. ?Do not have throw rugs and other things on the floor that can make you trip. ?What can I do with my stairs? ?Do not leave any items on the stairs. ?Make sure that there are handrails on both sides of the stairs and use them. Fix handrails that are broken or loose. Make sure that handrails are as long as the stairways. ?Check any carpeting to make sure that it is firmly attached to the stairs. Fix any carpet that is loose or worn. ?Avoid having throw  rugs at the top or bottom of the stairs. If you do have throw rugs, attach them to the floor with carpet tape. ?Make sure that you have a light switch at the top of the stairs and the bottom of the stairs. If you do not have them, ask someone to add them for you. ?What else can I do to help prevent falls? ?Wear shoes that: ?Do not have high heels. ?Have rubber bottoms. ?Are comfortable and fit you well. ?Are closed at the toe. Do not wear sandals. ?If you use a stepladder: ?Make sure that it is fully opened. Do not climb a closed stepladder. ?Make sure that both sides of the stepladder are locked into place. ?Ask someone to hold it for you, if possible. ?Clearly  mark and make sure that you can see: ?Any grab bars or handrails. ?First and last steps. ?Where the edge of each step is. ?Use tools that help you move around (mobility aids) if they are needed. These include:

## 2022-01-12 ENCOUNTER — Encounter: Payer: Medicare Other | Admitting: Medical

## 2022-01-17 ENCOUNTER — Ambulatory Visit (INDEPENDENT_AMBULATORY_CARE_PROVIDER_SITE_OTHER): Payer: Medicare Other | Admitting: Medical

## 2022-01-17 ENCOUNTER — Encounter: Payer: Self-pay | Admitting: Medical

## 2022-01-17 VITALS — BP 118/72 | HR 69 | Temp 97.8°F | Wt 120.2 lb

## 2022-01-17 DIAGNOSIS — Z13220 Encounter for screening for lipoid disorders: Secondary | ICD-10-CM | POA: Diagnosis not present

## 2022-01-17 DIAGNOSIS — Z8679 Personal history of other diseases of the circulatory system: Secondary | ICD-10-CM

## 2022-01-17 DIAGNOSIS — E039 Hypothyroidism, unspecified: Secondary | ICD-10-CM | POA: Diagnosis not present

## 2022-01-17 DIAGNOSIS — D72829 Elevated white blood cell count, unspecified: Secondary | ICD-10-CM | POA: Diagnosis not present

## 2022-01-17 DIAGNOSIS — Z7185 Encounter for immunization safety counseling: Secondary | ICD-10-CM

## 2022-01-17 DIAGNOSIS — Z136 Encounter for screening for cardiovascular disorders: Secondary | ICD-10-CM

## 2022-01-17 DIAGNOSIS — R9431 Abnormal electrocardiogram [ECG] [EKG]: Secondary | ICD-10-CM

## 2022-01-17 DIAGNOSIS — Z8249 Family history of ischemic heart disease and other diseases of the circulatory system: Secondary | ICD-10-CM | POA: Insufficient documentation

## 2022-01-17 DIAGNOSIS — M8580 Other specified disorders of bone density and structure, unspecified site: Secondary | ICD-10-CM

## 2022-01-17 NOTE — Assessment & Plan Note (Signed)
I recommend the following vaccines, Prevnar pneumococcal 20 vaccine once, shingles Shingrix vaccine 2 shots 2 months apart, and an updated tetanus booster  ? ?You can get the shingles and tetanus vaccines at your pharmacy ? ?You could have a Prevnar 20 vaccine here or at your pharmacy ? ?Make sure your pharmacy sends [place] notice of vaccine given ?

## 2022-01-17 NOTE — Assessment & Plan Note (Signed)
No recent concerns.  Heart rhythm is stable today normal sinus rhythm ?

## 2022-01-17 NOTE — Assessment & Plan Note (Signed)
Updated labs today.  Continue current medication ?

## 2022-01-17 NOTE — Progress Notes (Signed)
Subjective: ?Chief Complaint  ?Patient presents with  ? other  ?  Med check no other issues   ? ?Here for med check ? ?Medical team: ?Dr. Molli Posey, [facility] ?Dentist ?Eye doctor ?Dr. Cyd Silence, orthopedics ?Dr. Rozann Lesches, cardiology ?Dr. Zenovia Jarred, gastroenterology ?Shellyann Wandrey, Camelia Eng, PA-C here for primary care ? ?Here for med check. ? ?Feeling fine without complaint ? ?Hypothyroidism-compliant with levothyroxine 75 mcg daily ? ?She sees her gynecologist in a few months and has scheduled to do bone density test update.  Her last bone density test in 2021 showed osteopenia.  She is getting some limited weightbearing exercise but does walk regularly. ? ?She is inquiring about doing a CT coronary test.  Has questions about this test. ? ?Past Medical History:  ?Diagnosis Date  ? Hypothyroidism   ? PAF (paroxysmal atrial fibrillation) ([place])   ? Noted in setting of acute illness  ? Tubular adenoma 07/2019  ? colonoscopy, Dr. Ulice Dash Pyrtle  ? ?Family History  ?Problem Relation Age of Onset  ? Arrhythmia Mother   ?     atrial fib  ? Colon cancer Mother 67  ? Dementia Mother   ? COPD Mother   ? Cancer Mother   ? Heart attack Father   ?     CABG x9, maybe in 75s  ? Heart failure Father   ?     died of CHF  ? Diabetes Father   ? Esophageal cancer Neg Hx   ? Rectal cancer Neg Hx   ? Stomach cancer Neg Hx   ? Heart disease Neg Hx   ? ?ROS as in subjective ? ? ?Objective: ?BP 118/72   Pulse 69   Temp 97.8 ?F (36.6 ?C)   Wt 120 lb 3.2 oz (54.5 kg)   BMI 20.00 kg/m?  ? ?Physical Exam ?Vitals and nursing note reviewed.  ?Neck:  ?   Vascular: No carotid bruit.  ?Cardiovascular:  ?   Rate and Rhythm: Normal rate and regular rhythm.  ?   Pulses: Normal pulses.  ?   Heart sounds: Normal heart sounds. No murmur heard. ?Pulmonary:  ?   Effort: Pulmonary effort is normal.  ?   Breath sounds: Normal breath sounds.  ?Musculoskeletal:  ?   Cervical back: Normal range of motion and neck supple. No  tenderness.  ?Lymphadenopathy:  ?   Cervical: No cervical adenopathy.  ?Skin: ?   General: Skin is warm and dry.  ?Neurological:  ?   Mental Status: She is alert.  ?Psychiatric:     ?   Mood and Affect: Mood normal.     ?   Behavior: Behavior normal.     ?   Judgment: Judgment normal.  ? ? ? ?Assessment: ?Encounter Diagnoses  ?Name Primary?  ? Vaccine counseling Yes  ? Hypothyroidism, unspecified type   ? Leukocytosis, unspecified type   ? Screening for lipid disorders   ? History of atrial fibrillation   ? Screening for heart disease   ? Family history of premature CAD   ? Abnormal electrocardiogram (ECG) (EKG)   ? Osteopenia, unspecified location   ? ? ? ?Plan: ?Problem List Items Addressed This Visit   ? ? Hypothyroidism  ?  Updated labs today.  Continue current medication ? ?  ?  ? Relevant Orders  ? TSH + free T4  ? Comprehensive metabolic panel  ? History of atrial fibrillation  ?  No recent concerns.  Heart rhythm is stable today  normal sinus rhythm ? ?  ?  ? Vaccine counseling - Primary  ?  I recommend the following vaccines, Prevnar pneumococcal 20 vaccine once, shingles Shingrix vaccine 2 shots 2 months apart, and an updated tetanus booster  ? ?You can get the shingles and tetanus vaccines at your pharmacy ? ?You could have a Prevnar 20 vaccine here or at your pharmacy ? ?Make sure your pharmacy sends [place] notice of vaccine given ? ?  ?  ? Screening for lipid disorders  ? Relevant Orders  ? Lipid panel  ? Leukocytosis  ? Relevant Orders  ? CBC with Differential/Platelet  ? Osteopenia  ?  Counsel need for weightbearing exercise and aerobic exercise.  She will have gynecology forward a copy of her upcoming bone density test ? ?  ?  ? Family history of premature CAD  ?  We will work to schedule a baseline CT coronary score ? ?  ?  ? Relevant Orders  ? CT CARDIAC SCORING ([facility] LOCATIONS ONLY)  ? ?Other Visit Diagnoses   ? ? Screening for heart disease      ? Relevant Orders  ? CT CARDIAC SCORING ([facility] LOCATIONS  ONLY)  ? Abnormal electrocardiogram (ECG) (EKG)      ? Relevant Orders  ? CT CARDIAC SCORING ([facility] LOCATIONS ONLY)  ? ?  ? ? ?F/u pending labs. ? ?

## 2022-01-17 NOTE — Assessment & Plan Note (Signed)
We will work to schedule a baseline CT coronary score ?

## 2022-01-17 NOTE — Assessment & Plan Note (Signed)
Counsel need for weightbearing exercise and aerobic exercise.  She will have gynecology forward a copy of her upcoming bone density test ?

## 2022-01-18 ENCOUNTER — Other Ambulatory Visit: Payer: Self-pay | Admitting: Medical

## 2022-01-18 LAB — CBC WITH DIFFERENTIAL/PLATELET
Basophils Absolute: 0.1 10*3/uL (ref 0.0–0.2)
Basos: 1 %
EOS (ABSOLUTE): 0.1 10*3/uL (ref 0.0–0.4)
Eos: 1 %
Hematocrit: 42.4 % (ref 34.0–46.6)
Hemoglobin: 14.2 g/dL (ref 11.1–15.9)
Immature Grans (Abs): 0.1 10*3/uL (ref 0.0–0.1)
Immature Granulocytes: 1 %
Lymphocytes Absolute: 1.9 10*3/uL (ref 0.7–3.1)
Lymphs: 12 %
MCH: 29.6 pg (ref 26.6–33.0)
MCHC: 33.5 g/dL (ref 31.5–35.7)
MCV: 89 fL (ref 79–97)
Monocytes Absolute: 1.1 10*3/uL — ABNORMAL HIGH (ref 0.1–0.9)
Monocytes: 7 %
Neutrophils Absolute: 12.1 10*3/uL — ABNORMAL HIGH (ref 1.4–7.0)
Neutrophils: 78 %
Platelets: 376 10*3/uL (ref 150–450)
RBC: 4.79 x10E6/uL (ref 3.77–5.28)
RDW: 12.4 % (ref 11.7–15.4)
WBC: 15.3 10*3/uL — ABNORMAL HIGH (ref 3.4–10.8)

## 2022-01-18 LAB — COMPREHENSIVE METABOLIC PANEL
ALT: 9 IU/L (ref 0–32)
AST: 19 IU/L (ref 0–40)
Albumin/Globulin Ratio: 1.7 (ref 1.2–2.2)
Albumin: 4.3 g/dL (ref 3.8–4.8)
Alkaline Phosphatase: 68 IU/L (ref 44–121)
BUN/Creatinine Ratio: 12 (ref 12–28)
BUN: 10 mg/dL (ref 8–27)
Bilirubin Total: 1 mg/dL (ref 0.0–1.2)
CO2: 24 mmol/L (ref 20–29)
Calcium: 11 mg/dL — ABNORMAL HIGH (ref 8.7–10.3)
Chloride: 101 mmol/L (ref 96–106)
Creatinine, Ser: 0.83 mg/dL (ref 0.57–1.00)
Globulin, Total: 2.5 g/dL (ref 1.5–4.5)
Glucose: 84 mg/dL (ref 70–99)
Potassium: 4.1 mmol/L (ref 3.5–5.2)
Sodium: 140 mmol/L (ref 134–144)
Total Protein: 6.8 g/dL (ref 6.0–8.5)
eGFR: 78 mL/min/{1.73_m2} (ref >59)

## 2022-01-18 LAB — LIPID PANEL
Chol/HDL Ratio: 3.7 ratio (ref 0.0–4.4)
Cholesterol, Total: 210 mg/dL — ABNORMAL HIGH (ref 100–199)
HDL: 57 mg/dL (ref >39)
LDL Chol Calc (NIH): 131 mg/dL — ABNORMAL HIGH (ref 0–99)
Triglycerides: 125 mg/dL (ref 0–149)
VLDL Cholesterol Cal: 22 mg/dL (ref 5–40)

## 2022-01-18 LAB — TSH+FREE T4
Free T4: 1.55 ng/dL (ref 0.82–1.77)
TSH: 1.58 u[IU]/mL (ref 0.450–4.500)

## 2022-01-18 MED ORDER — LEVOTHYROXINE SODIUM 75 MCG PO TABS: ORAL_TABLET | ORAL | 3 refills | Status: DC

## 2022-01-19 ENCOUNTER — Other Ambulatory Visit: Payer: Self-pay | Admitting: Medical

## 2022-01-19 DIAGNOSIS — D72829 Elevated white blood cell count, unspecified: Secondary | ICD-10-CM

## 2022-01-19 DIAGNOSIS — E039 Hypothyroidism, unspecified: Secondary | ICD-10-CM

## 2022-01-19 DIAGNOSIS — R531 Weakness: Secondary | ICD-10-CM

## 2022-01-23 ENCOUNTER — Other Ambulatory Visit: Payer: Self-pay | Admitting: Internal Medicine

## 2022-01-23 MED ORDER — LEVOTHYROXINE SODIUM 75 MCG PO TABS: ORAL_TABLET | ORAL | 3 refills | Status: DC

## 2022-01-24 LAB — LACTATE DEHYDROGENASE, ISOENZYMES

## 2022-01-24 LAB — SPECIMEN STATUS REPORT

## 2022-01-25 ENCOUNTER — Other Ambulatory Visit: Payer: Self-pay | Admitting: Internal Medicine

## 2022-01-25 DIAGNOSIS — D72829 Elevated white blood cell count, unspecified: Secondary | ICD-10-CM

## 2022-02-19 ENCOUNTER — Ambulatory Visit
Admission: RE | Admit: 2022-02-19 | Discharge: 2022-02-19 | Disposition: A | Discharge status: Home / Self Care | Payer: No Typology Code available for payment source | Source: Ambulatory Visit | Attending: Medical | Admitting: Medical

## 2022-02-19 DIAGNOSIS — Z136 Encounter for screening for cardiovascular disorders: Secondary | ICD-10-CM

## 2022-02-19 DIAGNOSIS — R9431 Abnormal electrocardiogram [ECG] [EKG]: Secondary | ICD-10-CM

## 2022-02-19 DIAGNOSIS — Z8249 Family history of ischemic heart disease and other diseases of the circulatory system: Secondary | ICD-10-CM

## 2022-02-20 ENCOUNTER — Telehealth: Payer: Self-pay | Admitting: Medical

## 2022-02-20 ENCOUNTER — Other Ambulatory Visit: Payer: Self-pay | Admitting: Medical

## 2022-02-20 MED ORDER — ROSUVASTATIN CALCIUM 10 MG PO TABS: 10.0000 mg | ORAL_TABLET | Freq: Every day | ORAL | 3 refills | Status: DC

## 2022-02-20 NOTE — Telephone Encounter (Signed)
Allison Koch called and she agrees to take the statin medication, says she will call back to reschedule 3 month follow up.

## 2022-02-23 ENCOUNTER — Inpatient Hospital Stay (HOSPITAL_COMMUNITY): Payer: Medicare Other

## 2022-02-23 ENCOUNTER — Inpatient Hospital Stay (HOSPITAL_COMMUNITY): Payer: Medicare Other | Attending: Hematology | Admitting: Hematology

## 2022-02-23 VITALS — BP 181/68 | HR 72 | Temp 97.8°F | Resp 16 | Ht 65.0 in | Wt 117.9 lb

## 2022-02-23 DIAGNOSIS — E559 Vitamin D deficiency, unspecified: Secondary | ICD-10-CM

## 2022-02-23 DIAGNOSIS — Z8249 Family history of ischemic heart disease and other diseases of the circulatory system: Secondary | ICD-10-CM | POA: Insufficient documentation

## 2022-02-23 DIAGNOSIS — I700 Atherosclerosis of aorta: Secondary | ICD-10-CM | POA: Insufficient documentation

## 2022-02-23 DIAGNOSIS — Z818 Family history of other mental and behavioral disorders: Secondary | ICD-10-CM | POA: Insufficient documentation

## 2022-02-23 DIAGNOSIS — Z880 Allergy status to penicillin: Secondary | ICD-10-CM | POA: Insufficient documentation

## 2022-02-23 DIAGNOSIS — Z882 Allergy status to sulfonamides status: Secondary | ICD-10-CM | POA: Insufficient documentation

## 2022-02-23 DIAGNOSIS — D72829 Elevated white blood cell count, unspecified: Secondary | ICD-10-CM | POA: Insufficient documentation

## 2022-02-23 DIAGNOSIS — Z800 Family history of malignant neoplasm of digestive organs: Secondary | ICD-10-CM | POA: Diagnosis not present

## 2022-02-23 DIAGNOSIS — Z79899 Other long term (current) drug therapy: Secondary | ICD-10-CM | POA: Diagnosis not present

## 2022-02-23 DIAGNOSIS — Z833 Family history of diabetes mellitus: Secondary | ICD-10-CM | POA: Diagnosis not present

## 2022-02-23 DIAGNOSIS — Z808 Family history of malignant neoplasm of other organs or systems: Secondary | ICD-10-CM | POA: Insufficient documentation

## 2022-02-23 DIAGNOSIS — Z836 Family history of other diseases of the respiratory system: Secondary | ICD-10-CM | POA: Insufficient documentation

## 2022-02-23 DIAGNOSIS — E039 Hypothyroidism, unspecified: Secondary | ICD-10-CM | POA: Diagnosis not present

## 2022-02-23 LAB — CBC WITH DIFFERENTIAL/PLATELET
Abs Immature Granulocytes: 0.05 10*3/uL (ref 0.00–0.07)
Basophils Absolute: 0.1 10*3/uL (ref 0.0–0.1)
Basophils Relative: 1 %
Eosinophils Absolute: 0.1 10*3/uL (ref 0.0–0.5)
Eosinophils Relative: 1 %
HCT: 42.7 % (ref 36.0–46.0)
Hemoglobin: 13.8 g/dL (ref 12.0–15.0)
Immature Granulocytes: 0 %
Lymphocytes Relative: 16 %
Lymphs Abs: 1.9 10*3/uL (ref 0.7–4.0)
MCH: 29.7 pg (ref 26.0–34.0)
MCHC: 32.3 g/dL (ref 30.0–36.0)
MCV: 91.8 fL (ref 80.0–100.0)
Monocytes Absolute: 1 10*3/uL (ref 0.1–1.0)
Monocytes Relative: 9 %
Neutro Abs: 8.3 10*3/uL — ABNORMAL HIGH (ref 1.7–7.7)
Neutrophils Relative %: 73 %
Platelets: 338 10*3/uL (ref 150–400)
RBC: 4.65 MIL/uL (ref 3.87–5.11)
RDW: 12.9 % (ref 11.5–15.5)
WBC: 11.3 10*3/uL — ABNORMAL HIGH (ref 4.0–10.5)
nRBC: 0 % (ref 0.0–0.2)

## 2022-02-23 LAB — RETICULOCYTES
Immature Retic Fract: 11.1 % (ref 2.3–15.9)
RBC.: 4.63 MIL/uL (ref 3.87–5.11)
Retic Count, Absolute: 102.3 10*3/uL (ref 19.0–186.0)
Retic Ct Pct: 2.2 % (ref 0.4–3.1)

## 2022-02-23 LAB — C-REACTIVE PROTEIN: CRP: 0.6 mg/dL (ref <1.0)

## 2022-02-23 LAB — LACTATE DEHYDROGENASE: LDH: 127 U/L (ref 98–192)

## 2022-02-23 LAB — SEDIMENTATION RATE: Sed Rate: 5 mm/hr (ref 0–22)

## 2022-02-23 NOTE — Patient Instructions (Addendum)
Allison Koch at [facility] Discharge Instructions  You were seen and examined today by Dr. Delton Coombes. Dr. Delton Coombes is a hematologist, meaning that he specializes in blood abnormalities. Dr. Delton Coombes discussed your past medical history, family history of cancers/blood conditions and the events that led to you being here today.  You were referred to Dr. Delton Coombes for elevated white blood cells. Dr. Delton Coombes has recommended additional labs today in an attempt to identify the cause of the elevated white blood cells (WBCs).  WBCs can be elevated in the presence of stress or certain medications. Anything more serious will be ruled out with blood work.  Also noted on your blood work, your calcium was elevated. Dr. Delton Coombes will check additional labs in relation to this.  Follow-up as scheduled.   Thank you for choosing [place] at [facility] to provide your oncology and hematology care.  To afford each patient quality time with our provider, please arrive at least 15 minutes before your scheduled appointment time.   If you have a lab appointment with the [facility] please come in thru the Main Entrance and check in at the main information desk.  You need to re-schedule your appointment should you arrive 10 or more minutes late.  We strive to give you quality time with our providers, and arriving late affects you and other patients whose appointments are after yours.  Also, if you no show three or more times for appointments you may be dismissed from the clinic at the providers discretion.     Again, thank you for choosing [facility].  Our hope is that these requests will decrease the amount of time that you wait before being seen by our physicians.       _____________________________________________________________  Should you have questions after your visit to [facility], please contact our office at [phone] and follow the prompts.  Our office hours are 8:00 a.m. and 4:30 p.m. Monday - Friday.  Please note that voicemails left after 4:00 p.m. may not be returned until the following business day.  We are closed weekends and major holidays.  You do have access to a nurse 24-7, just call the main number to the clinic [phone] and do not press any options, hold on the line and a nurse will answer the phone.    For prescription refill requests, have your pharmacy contact our office and allow 72 hours.    Due to Covid, you will need to wear a mask upon entering the hospital. If you do not have a mask, a mask will be given to you at the Main Entrance upon arrival. For doctor visits, patients may have 1 support person age 35 or older with them. For treatment visits, patients can not have anyone with them due to social distancing guidelines and our immunocompromised population.

## 2022-02-23 NOTE — Progress Notes (Signed)
[place] [address] [place] [address]   CLINIC:  Medical Oncology/Hematology  Patient Care Team: Tysinger, Camelia Eng, PA-C as PCP - General (Family Medicine) Satira Sark, MD as PCP - Cardiology (Cardiology) Satira Sark, MD as Consulting Physician (Cardiology)  CHIEF COMPLAINTS/PURPOSE OF CONSULTATION:  Evaluation for leukocytosis  HISTORY OF PRESENTING ILLNESS:  Allison Koch 66 y.o. female is here because of evaluation for leukocytosis, at the request of Chana Bode, PA-C.  Today she reports feeling well. She denies fevers, night sweats, and weight loss. She denies recent infection or steroid use. She denies lupus and RA. She takes Calcium + Vitamin D BID. She denies personal history of cancer. She reports elevated anxiety levels over the past few years as she helps care for her mother who has colon cancer. She denies skin rash and joint pains.   She lives at home with her husband. 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 She denies chemical exposure and smoking history. Her mother had colon cancer, a paternal aunt had cancer "in her foot", and a maternal aunt had brain cancer.  MEDICAL HISTORY:  Past Medical History:  Diagnosis Date   Hypothyroidism    PAF (paroxysmal atrial fibrillation) ([facility])    Noted in setting of acute illness   Tubular adenoma 07/2019   colonoscopy, Dr. Zenovia Jarred    SURGICAL HISTORY: Past Surgical History:  Procedure Laterality Date   COLONOSCOPY  07/2019   Dr. Zenovia Jarred, tubular adenoma, repeat 5 years   EYE SURGERY     TONSILLECTOMY     UTERINE FIBROID SURGERY     WISDOM TOOTH EXTRACTION      SOCIAL HISTORY: Social History   Socioeconomic History   Marital status: Married    Spouse name: Not on file   Number of children: Not on file   Years of education: Not on file   Highest education level: Not on file  Occupational History   Not on file  Tobacco Use   Smoking status: Never   Smokeless  tobacco: Never  Vaping Use   Vaping Use: Never used  Substance and Sexual Activity   Alcohol use: No   Drug use: No   Sexual activity: Not on file  Other Topics Concern   Not on file  Social History Narrative   Chiropractor 4H camp, [place]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   Social Determinants of Health   Financial Resource Strain: Low Risk  (01/05/2022)   Overall Financial Resource Strain (CARDIA)    Difficulty of Paying Living Expenses: Not hard at all  Food Insecurity: No Food Insecurity (01/05/2022)   Hunger Vital Sign    Worried About Running Out of Food in the Last Year: Never true    Ran Out of Food in the Last Year: Never true  Transportation Needs: No Transportation Needs (01/05/2022)   PRAPARE - Hydrologist (Medical): No    Lack of Transportation (Non-Medical): No  Physical Activity: Insufficiently Active (01/05/2022)   Exercise Vital Sign    Days of Exercise per Week: 7 days    Minutes of Exercise per Session: 20 min  Stress: No Stress Concern Present (01/05/2022)   [place]    Feeling of Stress : Not at all  Social Connections: Not on file  Intimate Partner Violence: Not on file    FAMILY HISTORY: Family History  Problem Relation Age of Onset   Arrhythmia  Mother        atrial fib   Colon cancer Mother 65   Dementia Mother    COPD Mother    Cancer Mother    Heart attack Father        CABG x9, maybe in 6s   Heart failure Father        died of CHF   Diabetes Father    Esophageal cancer Neg Hx    Rectal cancer Neg Hx    Stomach cancer Neg Hx    Heart disease Neg Hx     ALLERGIES:  is allergic to other, penicillins, and sulfa antibiotics.  MEDICATIONS:  Current Outpatient Medications  Medication Sig Dispense Refill   Calcium Carbonate-Vitamin D 600-400 MG-UNIT per chew tablet Chew 1 tablet by mouth 2 (two) times daily.     estradiol (VIVELLE-DOT) 0.05 MG/24HR patch  estradiol 0.05 mg/24 hr semiweekly transdermal patch  APPLY ONE PATCH ON THE SKIN TWICE A WEEK     levothyroxine (SYNTHROID) 75 MCG tablet TAKE 1 TABLET BY MOUTH EVERY DAY BEFORE BREAKFAST 90 tablet 3   progesterone (PROMETRIUM) 100 MG capsule Take 100 mg by mouth at bedtime.     rosuvastatin (CRESTOR) 10 MG tablet Take 1 tablet (10 mg total) by mouth daily. 90 tablet 3   Current Facility-Administered Medications  Medication Dose Route Frequency Provider Last Rate Last Admin   0.9 %  sodium chloride infusion  500 mL Intravenous Continuous Pyrtle, Lajuan Lines, MD        REVIEW OF SYSTEMS:   Review of Systems  Constitutional:  Negative for appetite change, fatigue, fever and unexpected weight change.  Endocrine: Negative for hot flashes.  Musculoskeletal:  Negative for arthralgias.  Skin:  Negative for rash.  Psychiatric/Behavioral:  The patient is nervous/anxious.   All other systems reviewed and are negative.    PHYSICAL EXAMINATION: ECOG PERFORMANCE STATUS: 1 - Symptomatic but completely ambulatory  Vitals:   02/23/22 1120  BP: (!) 181/68  Pulse: 72  Resp: 16  Temp: 97.8 F (36.6 C)  SpO2: 100%   Filed Weights   02/23/22 1120  Weight: 117 lb 15.1 oz (53.5 kg)   Physical Exam Vitals reviewed.  Constitutional:      Appearance: Normal appearance.  Cardiovascular:     Rate and Rhythm: Normal rate and regular rhythm.     Pulses: Normal pulses.     Heart sounds: Normal heart sounds.  Pulmonary:     Effort: Pulmonary effort is normal.     Breath sounds: Normal breath sounds.  Abdominal:     Palpations: Abdomen is soft. There is no hepatomegaly, splenomegaly or mass.     Tenderness: There is no abdominal tenderness.  Lymphadenopathy:     Cervical: No cervical adenopathy.     Right cervical: No superficial cervical adenopathy.    Left cervical: No superficial cervical adenopathy.     Upper Body:     Right upper body: No supraclavicular or axillary adenopathy.     Left  upper body: No supraclavicular or axillary adenopathy.     Lower Body: No right inguinal adenopathy. No left inguinal adenopathy.  Neurological:     General: No focal deficit present.     Mental Status: She is alert and oriented to person, place, and time.  Psychiatric:        Mood and Affect: Mood normal.        Behavior: Behavior normal.      LABORATORY DATA:  I have reviewed  the data as listed Recent Results (from the past 2160 hour(s))  CBC with Differential/Platelet     Status: Abnormal   Collection Time: 01/17/22 11:06 AM  Result Value Ref Range   WBC 15.3 (H) 3.4 - 10.8 x10E3/uL   RBC 4.79 3.77 - 5.28 x10E6/uL   Hemoglobin 14.2 11.1 - 15.9 g/dL   Hematocrit 42.4 34.0 - 46.6 %   MCV 89 79 - 97 fL   MCH 29.6 26.6 - 33.0 pg   MCHC 33.5 31.5 - 35.7 g/dL   RDW 12.4 11.7 - 15.4 %   Platelets 376 150 - 450 x10E3/uL   Neutrophils 78 Not Estab. %   Lymphs 12 Not Estab. %   Monocytes 7 Not Estab. %   Eos 1 Not Estab. %   Basos 1 Not Estab. %   Neutrophils Absolute 12.1 (H) 1.4 - 7.0 x10E3/uL   Lymphocytes Absolute 1.9 0.7 - 3.1 x10E3/uL   Monocytes Absolute 1.1 (H) 0.1 - 0.9 x10E3/uL   EOS (ABSOLUTE) 0.1 0.0 - 0.4 x10E3/uL   Basophils Absolute 0.1 0.0 - 0.2 x10E3/uL   Immature Granulocytes 1 Not Estab. %   Immature Grans (Abs) 0.1 0.0 - 0.1 x10E3/uL  TSH + free T4     Status: None   Collection Time: 01/17/22 11:06 AM  Result Value Ref Range   TSH 1.580 0.450 - 4.500 uIU/mL   Free T4 1.55 0.82 - 1.77 ng/dL  Lipid panel     Status: Abnormal   Collection Time: 01/17/22 11:06 AM  Result Value Ref Range   Cholesterol, Total 210 (H) 100 - 199 mg/dL   Triglycerides 125 0 - 149 mg/dL   HDL 57 >39 mg/dL   VLDL Cholesterol Cal 22 5 - 40 mg/dL   LDL Chol Calc (NIH) 131 (H) 0 - 99 mg/dL   Chol/HDL Ratio 3.7 0.0 - 4.4 ratio    Comment:                                   T. Chol/HDL Ratio                                             Men  Women                               1/2  Avg.Risk  3.4    3.3                                   Avg.Risk  5.0    4.4                                2X Avg.Risk  9.6    7.1                                3X Avg.Risk 23.4   11.0   Comprehensive metabolic panel     Status: Abnormal   Collection Time: 01/17/22 11:06 AM  Result Value Ref Range   Glucose 84 70 - 99 mg/dL   BUN  10 8 - 27 mg/dL   Creatinine, Ser 0.83 0.57 - 1.00 mg/dL   eGFR 78 >59 mL/min/1.73   BUN/Creatinine Ratio 12 12 - 28   Sodium 140 134 - 144 mmol/L   Potassium 4.1 3.5 - 5.2 mmol/L   Chloride 101 96 - 106 mmol/L   CO2 24 20 - 29 mmol/L   Calcium 11.0 (H) 8.7 - 10.3 mg/dL   Total Protein 6.8 6.0 - 8.5 g/dL   Albumin 4.3 3.8 - 4.8 g/dL   Globulin, Total 2.5 1.5 - 4.5 g/dL   Albumin/Globulin Ratio 1.7 1.2 - 2.2   Bilirubin Total 1.0 0.0 - 1.2 mg/dL   Alkaline Phosphatase 68 44 - 121 IU/L   AST 19 0 - 40 IU/L   ALT 9 0 - 32 IU/L  Lactate dehydrogenase, isoenzymes     Status: None   Collection Time: 01/17/22 11:06 AM  Result Value Ref Range   LDH CANCELED IU/L    Comment: Test not performed. We have received your request for additional testing. We are not able to add the test(s) requested.  Result canceled by the ancillary.    (LD) Fraction 1 CANCELED %    Comment: Request for additional testing has been received, however, we are unable to add on the test(s) requested. The following test(s) were not performed.  Result canceled by the ancillary.    (LD) Fraction 2 CANCELED %    Comment: Request for additional testing has been received, however, we are unable to add on the test(s) requested. The following test(s) were not performed.  Result canceled by the ancillary.    (LD) Fraction 3 CANCELED %    Comment: Request for additional testing has been received, however, we are unable to add on the test(s) requested. The following test(s) were not performed.  Result canceled by the ancillary.    (LD) Fraction 4 CANCELED %    Comment: Request for  additional testing has been received, however, we are unable to add on the test(s) requested. The following test(s) were not performed.  Result canceled by the ancillary.    (LD) Fraction 5 CANCELED %    Comment: Request for additional testing has been received, however, we are unable to add on the test(s) requested. The following test(s) were not performed.  Result canceled by the ancillary.   Specimen status report     Status: None   Collection Time: 01/17/22 11:06 AM  Result Value Ref Range   specimen status report Comment     Comment: Written Authorization Written Authorization Written Authorization Received. Authorization received from [facility] 01-19-2022 Logged by [place] STUDIES: I have personally reviewed the radiological images as listed and agreed with the findings in the report. CT CARDIAC SCORING ([facility] LOCATIONS ONLY)  Result Date: 02/19/2022 CLINICAL DATA:  66 year old Caucasian female with family history of heart disease. EXAM: CT CARDIAC CORONARY ARTERY CALCIUM SCORE TECHNIQUE: Non-contrast imaging through the heart was performed using prospective ECG gating. Image post processing was performed on an independent workstation, allowing for quantitative analysis of the heart and coronary arteries. Note that this exam targets the heart and the chest was not imaged in its entirety. COMPARISON:  03/06/2004 FINDINGS: CORONARY CALCIUM SCORES: Left Main: 0 LAD: 0 LCx: 0 RCA: 0 Total Agatston Score: 0 MESA database percentile: [place] AORTA MEASUREMENTS: Ascending Aorta: 25 mm Descending Aorta: 19 mm OTHER FINDINGS: Cardiovascular: The heart is normal in size. Scattered atherosclerotic calcifications, most prominent about the  aortic arch. Mediastinum/Nodes: Visualized esophagus and trachea within normal limits. No mediastinal lymphadenopathy. Lungs/Pleura: The lungs are clear bilaterally. Upper Abdomen: The visualized upper abdomen is within normal  limits. Musculoskeletal: No acute osseous abnormality. IMPRESSION: 1. No evidence of coronary atherosclerotic calcifications. The observed calcium score of 0 is at the zeroth percentile for subjects of the same age, gender, and race/ethnicity who are free of clinical cardiovascular disease and treated diabetes. 2.  Aortic Atherosclerosis (ICD10-I70.0). Ruthann Cancer, MD [facility] Electronically Signed   By: Ruthann Cancer M.D.   On: 02/19/2022 11:00    ASSESSMENT:  Leukocytosis: - Patient seen for evaluation of leukocytosis, predominantly neutrophilic, intermittently since 2012. - She does not have any B symptoms/infections/connective tissue disorders.  Not on systemic steroids. - She denies any history of splenectomy. - Incidental finding of mild hypercalcemia (11), intermittently since 2012.  She takes calcium plus D 2 tablets daily.   Social/family history: - Lives at home with husband.  Retired from office type work.  No chemical exposure.  Non-smoker. - Mother had colon cancer.  Paternal aunt had cancer in her foot.  Maternal aunt had brain cancer.   PLAN:  Leukocytosis: - We reviewed differential diagnosis of neutrophilic leukocytosis including myeloproliferative neoplasms. - We will check for connective tissue disorders.  We will also check JAK2 V617F and BCR/ABL by FISH. - RTC 2 weeks to discuss.  2.  Mild hypercalcemia: - We will check intact PTH, vitamin D and SPEP levels.   All questions were answered. The patient knows to call the clinic with any problems, questions or concerns.  Derek Jack, MD 02/23/22 11:23 AM  [place] [phone]   I, Thana Ates, am acting as a scribe for Dr. Derek Jack.  I, Derek Jack MD, have reviewed the above documentation for accuracy and completeness, and I agree with the above.

## 2022-02-24 LAB — PTH, INTACT AND CALCIUM
Calcium, Total (PTH): 10.3 mg/dL (ref 8.7–10.3)
PTH: 10 pg/mL — ABNORMAL LOW (ref 15–65)

## 2022-02-24 LAB — RHEUMATOID FACTOR: Rheumatoid fact SerPl-aCnc: <10 IU/mL (ref <14.0)

## 2022-02-24 LAB — VITAMIN D 25 HYDROXY (VIT D DEFICIENCY, FRACTURES): Vit D, 25-Hydroxy: 54.29 ng/mL (ref 30–100)

## 2022-02-27 LAB — PROTEIN ELECTROPHORESIS, SERUM
A/G Ratio: 1.3 (ref 0.7–1.7)
Albumin ELP: 3.7 g/dL (ref 2.9–4.4)
Alpha-1-Globulin: 0.2 g/dL (ref 0.0–0.4)
Alpha-2-Globulin: 0.6 g/dL (ref 0.4–1.0)
Beta Globulin: 0.9 g/dL (ref 0.7–1.3)
Gamma Globulin: 1.1 g/dL (ref 0.4–1.8)
Globulin, Total: 2.8 g/dL (ref 2.2–3.9)
Total Protein ELP: 6.5 g/dL (ref 6.0–8.5)

## 2022-02-27 LAB — BCR-ABL1 FISH
Cells Analyzed: 200
Cells Counted: 200

## 2022-02-28 LAB — ANTINUCLEAR ANTIBODIES, IFA: ANA Ab, IFA: NEGATIVE

## 2022-03-04 LAB — JAK2 V617F RFX CALR/MPL/E12-15: JAK2 V617F %: 1.69 %

## 2022-03-07 LAB — VITAMIN D 1,25 DIHYDROXY
Vitamin D 1, 25 (OH)2 Total: 30 pg/mL
Vitamin D2 1, 25 (OH)2: <10 pg/mL
Vitamin D3 1, 25 (OH)2: 30 pg/mL

## 2022-03-09 ENCOUNTER — Ambulatory Visit: Payer: Medicare Other | Admitting: Medical

## 2022-03-19 NOTE — Progress Notes (Unsigned)
[place], [place] [address]   CLINIC:  Medical Oncology/Hematology  PCP:  Carlena Hurl, PA-C [address] [place] [address] [phone]   REASON FOR VISIT:  Follow-up for leukocytosis and hypercalcemia  PRIOR THERAPY: None  CURRENT THERAPY: Under work-up  INTERVAL HISTORY:  [name] 66 y.o. female returns for routine follow-up of her leukocytosis and mild hypercalcemia.  She was seen for initial consultation by Dr. Delton Coombes on 02/23/2022.  At today's visit, she reports feeling well.  She denies any changes in her symptoms or baseline health status since her visit with Dr. Delton Coombes last month.   She denies any recent infections or steroid use.  She denies any fever, night sweats, weight loss, or chills.  She has not noticed any new lumps or bumps.    She has 75% energy and 100% appetite. She endorses that she is maintaining a stable weight.   REVIEW OF SYSTEMS:    Review of Systems  Constitutional:  Negative for appetite change, chills, diaphoresis, fatigue, fever and unexpected weight change.  HENT:   Negative for lump/mass and nosebleeds.   Eyes:  Negative for eye problems.  Respiratory:  Negative for cough, hemoptysis and shortness of breath.   Cardiovascular:  Negative for chest pain, leg swelling and palpitations.  Gastrointestinal:  Negative for abdominal pain, blood in stool, constipation, diarrhea, nausea and vomiting.  Genitourinary:  Negative for hematuria.   Musculoskeletal:  Positive for arthralgias (Injury to left foot/ankle).  Skin: Negative.   Neurological:  Negative for dizziness, headaches and light-headedness.  Hematological:  Does not bruise/bleed easily.      PAST MEDICAL/SURGICAL HISTORY:  Past Medical History:  Diagnosis Date   Hypothyroidism    PAF (paroxysmal atrial fibrillation) (HCC)    Noted in setting of acute illness   Tubular adenoma 07/2019   colonoscopy, Dr. Zenovia Jarred    Past Surgical History:  Procedure Laterality Date   COLONOSCOPY  07/2019   Dr. Zenovia Jarred, tubular adenoma, repeat 5 years   EYE SURGERY     TONSILLECTOMY     UTERINE FIBROID SURGERY     WISDOM TOOTH EXTRACTION       SOCIAL HISTORY:  Social History   Socioeconomic History   Marital status: Married    Spouse name: Not on file   Number of children: Not on file   Years of education: Not on file   Highest education level: Not on file  Occupational History   Not on file  Tobacco Use   Smoking status: Never   Smokeless tobacco: Never  Vaping Use   Vaping Use: Never used  Substance and Sexual Activity   Alcohol use: No   Drug use: No   Sexual activity: Not on file  Other Topics Concern   Not on file  Social History Narrative   Chiropractor 4H camp, [place]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   Social Determinants of Health   Financial Resource Strain: Low Risk  (01/05/2022)   Overall Financial Resource Strain (CARDIA)    Difficulty of Paying Living Expenses: Not hard at all  Food Insecurity: No Food Insecurity (01/05/2022)   Hunger Vital Sign    Worried About Running Out of Food in the Last Year: Never true    Ran Out of Food in the Last Year: Never true  Transportation Needs: No Transportation Needs (01/05/2022)   PRAPARE - Hydrologist (Medical): No    Lack of Transportation (Non-Medical):  No  Physical Activity: Insufficiently Active (01/05/2022)   Exercise Vital Sign    Days of Exercise per Week: 7 days    Minutes of Exercise per Session: 20 min  Stress: No Stress Concern Present (01/05/2022)   [place]    Feeling of Stress : Not at all  Social Connections: Not on file  Intimate Partner Violence: Not on file    FAMILY HISTORY:  Family History  Problem Relation Age of Onset   Arrhythmia Mother        atrial fib   Colon cancer Mother 95   Dementia Mother    COPD Mother    Cancer  Mother    Heart attack Father        CABG x9, maybe in 58s   Heart failure Father        died of CHF   Diabetes Father    Esophageal cancer Neg Hx    Rectal cancer Neg Hx    Stomach cancer Neg Hx    Heart disease Neg Hx     CURRENT MEDICATIONS:  Outpatient Encounter Medications as of 03/21/2022  Medication Sig Note   Calcium Carbonate-Vitamin D 600-400 MG-UNIT per chew tablet Chew 1 tablet by mouth 2 (two) times daily.    estradiol (VIVELLE-DOT) 0.05 MG/24HR patch estradiol 0.05 mg/24 hr semiweekly transdermal patch  APPLY ONE PATCH ON THE SKIN TWICE A WEEK 01/17/2022: Changed mg 9.05 mg patch   levothyroxine (SYNTHROID) 75 MCG tablet TAKE 1 TABLET BY MOUTH EVERY DAY BEFORE BREAKFAST    progesterone (PROMETRIUM) 100 MG capsule Take 100 mg by mouth at bedtime.    rosuvastatin (CRESTOR) 10 MG tablet Take 1 tablet (10 mg total) by mouth daily.    Facility-Administered Encounter Medications as of 03/21/2022  Medication   0.9 %  sodium chloride infusion    ALLERGIES:  Allergies  Allergen Reactions   Other     Mycins per patient.   Penicillins     Tolerates amoxicillin   Sulfa Antibiotics Other (See Comments)     PHYSICAL EXAM:    ECOG PERFORMANCE STATUS: 0 - Asymptomatic  There were no vitals filed for this visit. There were no vitals filed for this visit. Physical Exam Constitutional:      Appearance: Normal appearance.  HENT:     Head: Normocephalic and atraumatic.     Mouth/Throat:     Mouth: Mucous membranes are moist.  Eyes:     Extraocular Movements: Extraocular movements intact.     Pupils: Pupils are equal, round, and reactive to light.  Cardiovascular:     Rate and Rhythm: Normal rate and regular rhythm.     Pulses: Normal pulses.     Heart sounds: Normal heart sounds.  Pulmonary:     Effort: Pulmonary effort is normal.     Breath sounds: Normal breath sounds.  Abdominal:     General: Bowel sounds are normal.     Palpations: Abdomen is soft. There is no  splenomegaly.     Tenderness: There is no abdominal tenderness.  Musculoskeletal:        General: No swelling.     Right lower leg: No edema.     Left lower leg: No edema.     Comments: Left foot in boot  Lymphadenopathy:     Head:     Right side of head: No submental, submandibular, tonsillar, preauricular, posterior auricular or occipital adenopathy.     Left side  of head: No submental, submandibular, tonsillar, preauricular, posterior auricular or occipital adenopathy.     Cervical: No cervical adenopathy.     Right cervical: No superficial, deep or posterior cervical adenopathy.    Left cervical: No superficial, deep or posterior cervical adenopathy.     Upper Body:     Right upper body: No supraclavicular adenopathy.     Left upper body: No supraclavicular adenopathy.  Skin:    General: Skin is warm and dry.  Neurological:     General: No focal deficit present.     Mental Status: She is alert and oriented to person, place, and time.  Psychiatric:        Mood and Affect: Mood normal.        Behavior: Behavior normal.      LABORATORY DATA:  I have reviewed the labs as listed.  CBC    Component Value Date/Time   WBC 11.3 (H) 02/23/2022 1147   RBC 4.65 02/23/2022 1147   RBC 4.63 02/23/2022 1147   HGB 13.8 02/23/2022 1147   HGB 14.2 01/17/2022 1106   HCT 42.7 02/23/2022 1147   HCT 42.4 01/17/2022 1106   PLT 338 02/23/2022 1147   PLT 376 01/17/2022 1106   MCV 91.8 02/23/2022 1147   MCV 89 01/17/2022 1106   MCH 29.7 02/23/2022 1147   MCHC 32.3 02/23/2022 1147   RDW 12.9 02/23/2022 1147   RDW 12.4 01/17/2022 1106   LYMPHSABS 1.9 02/23/2022 1147   LYMPHSABS 1.9 01/17/2022 1106   MONOABS 1.0 02/23/2022 1147   EOSABS 0.1 02/23/2022 1147   EOSABS 0.1 01/17/2022 1106   BASOSABS 0.1 02/23/2022 1147   BASOSABS 0.1 01/17/2022 1106      Latest Ref Rng & Units 02/23/2022   11:47 AM 01/17/2022   11:06 AM 02/21/2021    9:46 AM  CMP  Glucose 70 - 99 mg/dL  84  114   BUN 8 -  27 mg/dL  10  12   Creatinine 0.57 - 1.00 mg/dL  0.83  0.81   Sodium 134 - 144 mmol/L  140  141   Potassium 3.5 - 5.2 mmol/L  4.1  3.5   Chloride 96 - 106 mmol/L  101  100   CO2 20 - 29 mmol/L  24  19   Calcium 8.7 - 10.3 mg/dL 10.3  11.0  10.2   Total Protein 6.0 - 8.5 g/dL  6.8    Total Bilirubin 0.0 - 1.2 mg/dL  1.0    Alkaline Phos 44 - 121 IU/L  68    AST 0 - 40 IU/L  19    ALT 0 - 32 IU/L  9      DIAGNOSTIC IMAGING:  I have independently reviewed the relevant imaging and discussed with the patient.  ASSESSMENT & PLAN: 1.  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 - She does not have any B symptoms/infections.   - She denies any history of splenectomy.  No systemic steroids.  No history of connective tissue disorders. 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 Normal LDH, normal reticulocytes.  Rheumatoid factor and ANA negative.  Normal ESR and CRP. - Most recent CBC (02/23/2022): WBC 11.3/ANC 8.3, otherwise normal CBC with Hgb 13.8/HCT 42.7, platelets 338 - No palpable splenomegaly or lymphadenopathy on exam. - PLAN: No indication for cytoreductive therapy at this time.  We will continue close monitoring  and consider starting Hydrea if HCT >45%, platelets >400, or WBC >20.0.  Repeat labs (CBC/differential, LDH, uric acid) and RTC in 3 months.  2.  Mild hypercalcemia - Mild hypercalcemia intermittently since 2012 - SPEP unremarkable.  Vitamin D 25 and vitamin D 1, 25 both within normal limits. - Labs from 02/23/2022 show slightly low PTH at 10.  Normal calcium 10.3. - She takes calcium/vitamin D, 2 tablets daily - PLAN: Suspect hypercalcemia secondary to calcium supplementation.  Patient instructed to decrease her Caltrate to once daily.  We will recheck calcium, PTH, and vitamin D levels in 6 months.    3.   Other history - Lives at home with husband.  Retired from office type work.  No chemical exposure.  Non-smoker. - Mother had colon cancer.  Paternal aunt had cancer in her foot.  Maternal aunt had brain cancer.`   All questions were answered. The patient knows to call the clinic with any problems, questions or concerns.  Medical decision making: Moderate  Time spent on visit: I spent 20 minutes counseling the patient face to face. The total time spent in the appointment was 30 minutes and more than 50% was on counseling.   Harriett Rush, PA-C  03/21/2022 10:10 AM

## 2022-03-21 ENCOUNTER — Inpatient Hospital Stay (HOSPITAL_COMMUNITY): Payer: Medicare Other | Attending: Physician Assistant | Admitting: Physician Assistant

## 2022-03-21 ENCOUNTER — Encounter (HOSPITAL_COMMUNITY): Payer: Self-pay | Admitting: Physician Assistant

## 2022-03-21 VITALS — BP 135/74 | HR 98 | Temp 98.0°F | Resp 16 | Wt 119.0 lb

## 2022-03-21 DIAGNOSIS — Z79899 Other long term (current) drug therapy: Secondary | ICD-10-CM | POA: Insufficient documentation

## 2022-03-21 DIAGNOSIS — Z1589 Genetic susceptibility to other disease: Secondary | ICD-10-CM

## 2022-03-21 DIAGNOSIS — D72829 Elevated white blood cell count, unspecified: Secondary | ICD-10-CM | POA: Diagnosis present

## 2022-03-21 NOTE — Patient Instructions (Signed)
Allison Koch at [facility] Discharge Instructions  You were seen today by Tarri Abernethy PA-C for your elevated white blood cells and high calcium.  ELEVATED WHITE BLOOD CELLS: Your tests showed that you have a mutation called "JAK2 V617F," which can cause your bone marrow to produce too many blood cells.  At this time, you do not need any treatment, but we will continue to monitor you closely because you do have risk for developing more significant blood conditions in the future.  HIGH CALCIUM: This is likely due to the high-dose calcium supplements you are taking at home.  I recommended decreasing your Caltrate to ONCE daily instead of twice daily.  This can be further discussed with your primary care doctor and endocrinologist if needed.    FOLLOW-UP APPOINTMENT: Labs and office visit (same day) in 3 months.   Thank you for choosing [place] at [facility] to provide your oncology and hematology care.  To afford each patient quality time with our provider, please arrive at least 15 minutes before your scheduled appointment time.   If you have a lab appointment with the [facility] please come in thru the Main Entrance and check in at the main information desk.  You need to re-schedule your appointment should you arrive 10 or more minutes late.  We strive to give you quality time with our providers, and arriving late affects you and other patients whose appointments are after yours.  Also, if you no show three or more times for appointments you may be dismissed from the clinic at the providers discretion.     Again, thank you for choosing [facility].  Our hope is that these requests will decrease the amount of time that you wait before being seen by our physicians.       _____________________________________________________________  Should you have questions after your visit to [facility], please contact our office  at [phone] and follow the prompts.  Our office hours are 8:00 a.m. and 4:30 p.m. Monday - Friday.  Please note that voicemails left after 4:00 p.m. may not be returned until the following business day.  We are closed weekends and major holidays.  You do have access to a nurse 24-7, just call the main number to the clinic [phone] and do not press any options, hold on the line and a nurse will answer the phone.    For prescription refill requests, have your pharmacy contact our office and allow 72 hours.    Due to Covid, you will need to wear a mask upon entering the hospital. If you do not have a mask, a mask will be given to you at the Main Entrance upon arrival. For doctor visits, patients may have 1 support person age 38 or older with them. For treatment visits, patients can not have anyone with them due to social distancing guidelines and our immunocompromised population.

## 2022-04-09 LAB — HM DEXA SCAN

## 2022-04-09 LAB — HM MAMMOGRAPHY

## 2022-05-23 ENCOUNTER — Ambulatory Visit: Payer: Medicare Other | Admitting: Medical

## 2022-05-23 ENCOUNTER — Encounter: Payer: Self-pay | Admitting: Internal Medicine

## 2022-05-28 ENCOUNTER — Other Ambulatory Visit (HOSPITAL_COMMUNITY): Payer: Self-pay | Admitting: Sports Medicine

## 2022-05-28 ENCOUNTER — Ambulatory Visit (INDEPENDENT_AMBULATORY_CARE_PROVIDER_SITE_OTHER): Payer: Medicare Other | Admitting: Medical

## 2022-05-28 VITALS — BP 124/80 | HR 79 | Wt 116.2 lb

## 2022-05-28 DIAGNOSIS — M7989 Other specified soft tissue disorders: Secondary | ICD-10-CM

## 2022-05-28 DIAGNOSIS — M2550 Pain in unspecified joint: Secondary | ICD-10-CM | POA: Diagnosis not present

## 2022-05-28 DIAGNOSIS — M8580 Other specified disorders of bone density and structure, unspecified site: Secondary | ICD-10-CM

## 2022-05-28 DIAGNOSIS — Z7185 Encounter for immunization safety counseling: Secondary | ICD-10-CM | POA: Diagnosis not present

## 2022-05-28 DIAGNOSIS — Z1589 Genetic susceptibility to other disease: Secondary | ICD-10-CM | POA: Insufficient documentation

## 2022-05-28 DIAGNOSIS — E039 Hypothyroidism, unspecified: Secondary | ICD-10-CM

## 2022-05-28 DIAGNOSIS — Z2820 Immunization not carried out because of patient decision for unspecified reason: Secondary | ICD-10-CM

## 2022-05-28 DIAGNOSIS — D72829 Elevated white blood cell count, unspecified: Secondary | ICD-10-CM

## 2022-05-28 DIAGNOSIS — Z8249 Family history of ischemic heart disease and other diseases of the circulatory system: Secondary | ICD-10-CM

## 2022-05-28 NOTE — Progress Notes (Signed)
Subjective:  Allison Koch is a 66 y.o. female who presents for Chief Complaint  Patient presents with   follow-up    Follow-up on cholesterol but not fasting, declines flu shot     Medical team: Dr. Matthew Koch, [facility] for [facility] Koch Dentist Eye doctor Dr. Cyd Koch, orthopedics Dr. Rozann Koch, cardiology Dr. Zenovia Koch, gastroenterology  Concerns: Here for med check  After last visit we had recommend statin given risks.  She tried and can't tolerate Crestor.  After starting Crestor, right hand swelled up.   She stopped Crestor and the swelling gradually resolved. Then shortly thereafter, left ankle swelled weeks later and then resolved.  Left hand started swelling later after the ankle.  Saw Dr. Jennye Koch. Was started on Prednisone.  Still having some swelling in left hand  She is seeing rheumatology who is checking into whether Jak mutation which was determined in recent months by hematology has any bearing on risks for statin and swelling.    Otherwise in usual state of health  Hypothyroidism - compliant, no new concerns  She has f/u soon with rheumatology and hematology  We had referred to hematology given elevated WBC ongoing.  She was found to have Jak mutation.  She declines any vaccines until she gets this joint sweling figured out  No other aggravating or relieving factors.    No other c/o.  Family History  Problem Relation Age of Onset   Arrhythmia Mother        atrial fib   Colon cancer Mother 68   Dementia Mother    COPD Mother    Cancer Mother    Heart attack Father        CABG x9, maybe in 97s   Heart failure Father        died of CHF   Diabetes Father    Esophageal cancer Neg Hx    Rectal cancer Neg Hx    Stomach cancer Neg Hx    Heart disease Neg Hx      The following portions of the patient's history were reviewed and updated as appropriate: allergies, current medications, past family history, past medical history, past  social history, past surgical history and problem list.  ROS Otherwise as in subjective above  Objective: BP 124/80   Pulse 79   Wt 116 lb 3.2 oz (52.7 kg)   BMI 19.34 kg/m   General appearance: alert, no distress, well developed, well nourished Neck: supple, no lymphadenopathy, no thyromegaly, no masses Heart: RRR, normal S1, S2, no murmurs Lungs: CTA bilaterally, no wheezes, rhonchi, or rales MSK: left hand 2nd and 4rd MCPs swollen mildly and tender, no other obvious joint swelling today Pulses: 2+ radial pulses, 2+ pedal pulses, normal cap refill Ext: no edema   Assessment: Encounter Diagnoses  Name Primary?   Hypothyroidism, unspecified type Yes   Vaccine refused by patient    Vaccine counseling    Polyarthralgia    Osteopenia, unspecified location    Leukocytosis, unspecified type    JAK2 gene mutation    Family history of premature CAD      Plan: Hypothyroidism-doing fine on current therapy, last labs in May were at goal.  I reviewed her June and July office visit note and labs from hematology regarding elevated white cells.  She also had elevated calcium.  The elevated calcium was thought to be due to increased calcium intake.  She was advised to decrease her calcium supplement dose which she has done.  She also was  found to have JAK2 mutation.  They did advise of her CBC results get to a certain level that they would institute Hydrea medication.  She sees them in October for recheck and labs  She is seeing rheumatology given recent joint pain and swelling.  It is not clear what the trigger is.  She has had several of the labs to help rule out some issues.  She did discontinue statin after joint swelling started.  Rheumatology is investigating other causes.  She follows up with them soon  She does not want to do any other vaccines right now until the joint swelling issue gets figured out  We discussed her heart disease risks which include age and family history.   Lipid profile 01/2022 was ok.   CT coronary calcium score 0 in 02/19/22.  Discussed her recent concerns with joint pain and swelling.  Rheumatology is checking other research as we don't know of any specific relation between statin and this type of swelling.   For now she is going to not take any lipid medication until she sees rheumatology and hematology again soon.  We discussed low cholesterol diet, regular exercise.     We will request copies of her most recent colonoscopy mammogram Pap smear and bone density test showed [facility].   Allison Koch was seen today for follow-up.  Diagnoses and all orders for this visit:  Hypothyroidism, unspecified type  Vaccine refused by patient  Vaccine counseling  Polyarthralgia  Osteopenia, unspecified location  Leukocytosis, unspecified type  JAK2 gene mutation  Family history of premature CAD    Follow up: with rheum and hematology soon as planned

## 2022-06-14 ENCOUNTER — Other Ambulatory Visit (HOSPITAL_COMMUNITY): Payer: 59

## 2022-06-14 ENCOUNTER — Telehealth: Payer: Self-pay | Admitting: Medical

## 2022-06-14 ENCOUNTER — Encounter: Payer: Self-pay | Admitting: Internal Medicine

## 2022-06-14 NOTE — Telephone Encounter (Signed)
Records received from [facility]

## 2022-06-15 ENCOUNTER — Encounter: Payer: Self-pay | Admitting: Medical

## 2022-06-18 ENCOUNTER — Ambulatory Visit (HOSPITAL_COMMUNITY)
Admission: RE | Admit: 2022-06-18 | Discharge: 2022-06-18 | Disposition: A | Discharge status: Home / Self Care | Payer: Medicare Other | Source: Ambulatory Visit | Attending: Sports Medicine | Admitting: Sports Medicine

## 2022-06-18 DIAGNOSIS — M7989 Other specified soft tissue disorders: Secondary | ICD-10-CM | POA: Insufficient documentation

## 2022-06-21 NOTE — Progress Notes (Signed)
Allison Koch, Allison Koch [id number]   CLINIC:  Medical Oncology/Hematology  PCP:  Caryl Ada Rutledge [place] [address] [phone]   REASON FOR VISIT:  Follow-up for JAK2+ leukocytosis and hypercalcemia  PRIOR THERAPY: None  CURRENT THERAPY: Surveillance  INTERVAL HISTORY:  Allison Koch 66 y.o. female returns for routine follow-up of her JAK2+ leukocytosis and mild hypercalcemia.  She was last seen by Tarri Abernethy, PA-C on 03/21/2022.  At today's visit, she reports feeling well.  She denies any changes in her symptoms or baseline health status since her last visit.  She has had some intermittent joint swelling after being placed on a statin medication.  She finished prednisone taper for this about 3 weeks ago.  She denies any recent infections.   She denies any fever, night sweats, weight loss, or chills.   She has not noticed any new lumps or bumps.  She decreased her Caltrate supplement to once daily after her visit 3 months ago.   She has 90% energy and 100% appetite. She endorses that she is maintaining a stable weight.   REVIEW OF SYSTEMS:    Review of Systems  Constitutional:  Negative for appetite change, chills, diaphoresis, fatigue, fever and unexpected weight change.  HENT:   Negative for lump/mass and nosebleeds.   Eyes:  Negative for eye problems.  Respiratory:  Negative for cough, hemoptysis and shortness of breath.   Cardiovascular:  Negative for chest pain, leg swelling and palpitations.  Gastrointestinal:  Negative for abdominal pain, blood in stool, constipation, diarrhea, nausea and vomiting.  Genitourinary:  Negative for hematuria.   Musculoskeletal:  Positive for arthralgias.  Skin: Negative.   Neurological:  Negative for dizziness, headaches and light-headedness.  Hematological:  Does not bruise/bleed easily.      PAST MEDICAL/SURGICAL HISTORY:  Past Medical History:  Diagnosis Date    Hypothyroidism    PAF (paroxysmal atrial fibrillation) (HCC)    Noted in setting of acute illness   Tubular adenoma 07/2019   colonoscopy, Dr. Zenovia Jarred   Past Surgical History:  Procedure Laterality Date   COLONOSCOPY  07/2019   Dr. Zenovia Jarred, tubular adenoma, repeat 5 years   EYE SURGERY     TONSILLECTOMY     UTERINE FIBROID SURGERY     WISDOM TOOTH EXTRACTION       SOCIAL HISTORY:  Social History   Socioeconomic History   Marital status: Married    Spouse name: Not on file   Number of children: Not on file   Years of education: Not on file   Highest education level: Not on file  Occupational History   Not on file  Tobacco Use   Smoking status: Never   Smokeless tobacco: Never  Vaping Use   Vaping Use: Never used  Substance and Sexual Activity   Alcohol use: No   Drug use: No   Sexual activity: Not on file  Other Topics Concern   Not on file  Social History Narrative   Chiropractor 4H camp, [place]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   Social Determinants of Health   Financial Resource Strain: Low Risk  (01/05/2022)   Overall Financial Resource Strain (CARDIA)    Difficulty of Paying Living Expenses: Not hard at all  Food Insecurity: No Food Insecurity (01/05/2022)   Hunger Vital Sign    Worried About Running Out of Food in the Last Year: Never true    Ran Out of Food in the Last  Year: Never true  Transportation Needs: No Transportation Needs (01/05/2022)   PRAPARE - Hydrologist (Medical): No    Lack of Transportation (Non-Medical): No  Physical Activity: Insufficiently Active (01/05/2022)   Exercise Vital Sign    Days of Exercise per Week: 7 days    Minutes of Exercise per Session: 20 min  Stress: No Stress Concern Present (01/05/2022)   [place]    Feeling of Stress : Not at all  Social Connections: Not on file  Intimate Partner Violence: Not on file    FAMILY HISTORY:   Family History  Problem Relation Age of Onset   Arrhythmia Mother        atrial fib   Colon cancer Mother 26   Dementia Mother    COPD Mother    Cancer Mother    Heart attack Father        CABG x9, maybe in 63s   Heart failure Father        died of CHF   Diabetes Father    Esophageal cancer Neg Hx    Rectal cancer Neg Hx    Stomach cancer Neg Hx    Heart disease Neg Hx     CURRENT MEDICATIONS:  Outpatient Encounter Medications as of 06/22/2022  Medication Sig Note   Calcium Carbonate-Vitamin D 600-400 MG-UNIT per chew tablet Chew 1 tablet by mouth 2 (two) times daily.    estradiol (VIVELLE-DOT) 0.05 MG/24HR patch estradiol 0.05 mg/24 hr semiweekly transdermal patch  APPLY ONE PATCH ON THE SKIN TWICE A WEEK 01/17/2022: Changed mg 9.05 mg patch   levothyroxine (SYNTHROID) 75 MCG tablet TAKE 1 TABLET BY MOUTH EVERY DAY BEFORE BREAKFAST    predniSONE (DELTASONE) 10 MG tablet Take by mouth.    progesterone (PROMETRIUM) 100 MG capsule Take 100 mg by mouth at bedtime.    No facility-administered encounter medications on file as of 06/22/2022.    ALLERGIES:  Allergies  Allergen Reactions   Other     Mycins per patient.   Penicillins    Sulfa Antibiotics Other (See Comments)     PHYSICAL EXAM:    ECOG PERFORMANCE STATUS: 0 - Asymptomatic  There were no vitals filed for this visit. There were no vitals filed for this visit. Physical Exam Constitutional:      Appearance: Normal appearance.  HENT:     Head: Normocephalic and atraumatic.     Mouth/Throat:     Mouth: Mucous membranes are moist.  Eyes:     Extraocular Movements: Extraocular movements intact.     Pupils: Pupils are equal, round, and reactive to light.  Cardiovascular:     Rate and Rhythm: Normal rate and regular rhythm.     Pulses: Normal pulses.     Heart sounds: Normal heart sounds.  Pulmonary:     Effort: Pulmonary effort is normal.     Breath sounds: Normal breath sounds.  Abdominal:     General:  Bowel sounds are normal.     Palpations: Abdomen is soft. There is no splenomegaly.     Tenderness: There is no abdominal tenderness.  Musculoskeletal:        General: No swelling.     Right lower leg: No edema.     Left lower leg: No edema.  Lymphadenopathy:     Head:     Right side of head: No submental, submandibular, tonsillar, preauricular, posterior auricular or occipital adenopathy.  Left side of head: No submental, submandibular, tonsillar, preauricular, posterior auricular or occipital adenopathy.     Cervical: No cervical adenopathy.     Right cervical: No superficial, deep or posterior cervical adenopathy.    Left cervical: No superficial, deep or posterior cervical adenopathy.     Upper Body:     Right upper body: No supraclavicular adenopathy.     Left upper body: No supraclavicular adenopathy.  Skin:    General: Skin is warm and dry.  Neurological:     General: No focal deficit present.     Mental Status: She is alert and oriented to person, place, and time.  Psychiatric:        Mood and Affect: Mood normal.        Behavior: Behavior normal.      LABORATORY DATA:  I have reviewed the labs as listed.  CBC    Component Value Date/Time   WBC 11.3 (H) 02/23/2022 1147   RBC 4.65 02/23/2022 1147   RBC 4.63 02/23/2022 1147   HGB 13.8 02/23/2022 1147   HGB 14.2 01/17/2022 1106   HCT 42.7 02/23/2022 1147   HCT 42.4 01/17/2022 1106   PLT 338 02/23/2022 1147   PLT 376 01/17/2022 1106   MCV 91.8 02/23/2022 1147   MCV 89 01/17/2022 1106   MCH 29.7 02/23/2022 1147   MCHC 32.3 02/23/2022 1147   RDW 12.9 02/23/2022 1147   RDW 12.4 01/17/2022 1106   LYMPHSABS 1.9 02/23/2022 1147   LYMPHSABS 1.9 01/17/2022 1106   MONOABS 1.0 02/23/2022 1147   EOSABS 0.1 02/23/2022 1147   EOSABS 0.1 01/17/2022 1106   BASOSABS 0.1 02/23/2022 1147   BASOSABS 0.1 01/17/2022 1106      Latest Ref Rng & Units 02/23/2022   11:47 AM 01/17/2022   11:06 AM 02/21/2021    9:46 AM  CMP   Glucose 70 - 99 mg/dL  84  114   BUN 8 - 27 mg/dL  10  12   Creatinine 0.57 - 1.00 mg/dL  0.83  0.81   Sodium 134 - 144 mmol/L  140  141   Potassium 3.5 - 5.2 mmol/L  4.1  3.5   Chloride 96 - 106 mmol/L  101  100   CO2 20 - 29 mmol/L  24  19   Calcium 8.7 - 10.3 mg/dL 10.3  11.0  10.2   Total Protein 6.0 - 8.5 g/dL  6.8    Total Bilirubin 0.0 - 1.2 mg/dL  1.0    Alkaline Phos 44 - 121 IU/L  68    AST 0 - 40 IU/L  19    ALT 0 - 32 IU/L  9      DIAGNOSTIC IMAGING:  I have independently reviewed the relevant imaging and discussed with the patient.  ASSESSMENT & PLAN: 1.  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 - She does not have any B symptoms/infections.   - She denies any history of splenectomy.  No systemic steroids.  No history of connective tissue disorders. 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 Normal LDH, normal reticulocytes.  Rheumatoid factor and ANA negative.  Normal ESR and CRP. - Most recent CBC (06/22/2022): Normal WBC 7.4, overall normal CBC, normal LDH, normal uric acid - No palpable splenomegaly or lymphadenopathy on exam.  - PLAN: No indication for cytoreductive therapy at this time.  We will continue  close monitoring and consider starting Hydrea if HCT >45%, platelets >400, or WBC >20.0.  Repeat labs (CBC/differential, LDH, uric acid) and RTC in 3 months.  2.  Mild hypercalcemia - Mild hypercalcemia intermittently since 2012 - SPEP unremarkable.  Vitamin D 25 and vitamin D 1, 25 both within normal limits. - Labs from 02/23/2022 show slightly low PTH at 10.  Normal calcium 10.3. - She previously took 2 tablets calcium/vitamin D daily, but decreased to 1 tablet daily in July 2023. - PLAN: Suspect hypercalcemia secondary to calcium supplementation.  Patient instructed to decrease her Caltrate to  once daily.  We will recheck calcium, PTH, and vitamin D levels in 6 months from last check (due around December 2023)  3.  Other history - Lives at home with husband.  Retired from office type work.  No chemical exposure.  Non-smoker. - Mother had colon cancer.  Paternal aunt had cancer in her foot.  Maternal aunt had brain cancer.`   All questions were answered. The patient knows to call the clinic with any problems, questions or concerns.  Medical decision making: Low  Time spent on visit: I spent 15 minutes counseling the patient face to face. The total time spent in the appointment was 22 minutes and more than 50% was on counseling.   Harriett Rush, PA-C  06/22/2022 11:38 AM

## 2022-06-22 ENCOUNTER — Inpatient Hospital Stay: Payer: Medicare Other

## 2022-06-22 ENCOUNTER — Inpatient Hospital Stay: Payer: Medicare Other | Attending: Physician Assistant | Admitting: Physician Assistant

## 2022-06-22 VITALS — BP 145/70 | HR 72 | Temp 98.3°F | Resp 16 | Ht 65.0 in | Wt 117.2 lb

## 2022-06-22 DIAGNOSIS — Z79899 Other long term (current) drug therapy: Secondary | ICD-10-CM | POA: Diagnosis not present

## 2022-06-22 DIAGNOSIS — I4891 Unspecified atrial fibrillation: Secondary | ICD-10-CM | POA: Diagnosis not present

## 2022-06-22 DIAGNOSIS — Z1589 Genetic susceptibility to other disease: Secondary | ICD-10-CM

## 2022-06-22 DIAGNOSIS — E119 Type 2 diabetes mellitus without complications: Secondary | ICD-10-CM | POA: Insufficient documentation

## 2022-06-22 DIAGNOSIS — I480 Paroxysmal atrial fibrillation: Secondary | ICD-10-CM | POA: Diagnosis not present

## 2022-06-22 DIAGNOSIS — E559 Vitamin D deficiency, unspecified: Secondary | ICD-10-CM | POA: Diagnosis not present

## 2022-06-22 DIAGNOSIS — Z79890 Hormone replacement therapy (postmenopausal): Secondary | ICD-10-CM | POA: Diagnosis not present

## 2022-06-22 DIAGNOSIS — F0390 Unspecified dementia without behavioral disturbance: Secondary | ICD-10-CM | POA: Insufficient documentation

## 2022-06-22 DIAGNOSIS — Z800 Family history of malignant neoplasm of digestive organs: Secondary | ICD-10-CM | POA: Diagnosis not present

## 2022-06-22 DIAGNOSIS — E039 Hypothyroidism, unspecified: Secondary | ICD-10-CM | POA: Insufficient documentation

## 2022-06-22 DIAGNOSIS — D72829 Elevated white blood cell count, unspecified: Secondary | ICD-10-CM

## 2022-06-22 LAB — URIC ACID: Uric Acid, Serum: 3.6 mg/dL (ref 2.5–7.1)

## 2022-06-22 LAB — CBC WITH DIFFERENTIAL/PLATELET
Abs Immature Granulocytes: 0.03 10*3/uL (ref 0.00–0.07)
Basophils Absolute: 0.1 10*3/uL (ref 0.0–0.1)
Basophils Relative: 1 %
Eosinophils Absolute: 0.2 10*3/uL (ref 0.0–0.5)
Eosinophils Relative: 3 %
HCT: 40.5 % (ref 36.0–46.0)
Hemoglobin: 13.3 g/dL (ref 12.0–15.0)
Immature Granulocytes: 0 %
Lymphocytes Relative: 22 %
Lymphs Abs: 1.6 10*3/uL (ref 0.7–4.0)
MCH: 29.6 pg (ref 26.0–34.0)
MCHC: 32.8 g/dL (ref 30.0–36.0)
MCV: 90 fL (ref 80.0–100.0)
Monocytes Absolute: 0.9 10*3/uL (ref 0.1–1.0)
Monocytes Relative: 12 %
Neutro Abs: 4.7 10*3/uL (ref 1.7–7.7)
Neutrophils Relative %: 62 %
Platelets: 298 10*3/uL (ref 150–400)
RBC: 4.5 MIL/uL (ref 3.87–5.11)
RDW: 13.6 % (ref 11.5–15.5)
WBC: 7.4 10*3/uL (ref 4.0–10.5)
nRBC: 0 % (ref 0.0–0.2)

## 2022-06-22 LAB — LACTATE DEHYDROGENASE: LDH: 121 U/L (ref 98–192)

## 2022-06-22 NOTE — Patient Instructions (Signed)
[facility] at [place] **   You were seen today by Tarri Abernethy PA-C for your elevated white blood cells.    Elevated white blood cells Your labs look great today! We will recheck these labs again in 3 months.  Arthritis There has been some association with the Jak 2 mutation and rheumatoid arthritis, although this is a different disease process than your elevated white blood cells. Continue to follow-up with your primary care doctor and orthopedic specialist for treatment of your arthritis.  You may benefit from referral to rheumatologist for additional testing.  FOLLOW-UP APPOINTMENT: Labs in 3 months with office visit 1 week after  ** Thank you for trusting me with your healthcare!  I strive to provide all of my patients with quality care at each visit.  If you receive a survey for this visit, I would be so grateful to you for taking the time to provide feedback.  Thank you in advance!  ~ Latissa Frick                   Dr. Derek Jack   &   Tarri Abernethy, PA-C   - - - - - - - - - - - - - - - - - -    Thank you for choosing [place] at [facility] to provide your oncology and hematology care.  To afford each patient quality time with our provider, please arrive at least 15 minutes before your scheduled appointment time.   If you have a lab appointment with the [facility] please come in thru the Main Entrance and check in at the main information desk.  You need to re-schedule your appointment should you arrive 10 or more minutes late.  We strive to give you quality time with our providers, and arriving late affects you and other patients whose appointments are after yours.  Also, if you no show three or more times for appointments you may be dismissed from the clinic at the providers discretion.     Again, thank you for choosing [facility].  Our hope is that these  requests will decrease the amount of time that you wait before being seen by our physicians.       _____________________________________________________________  Should you have questions after your visit to [facility], please contact our office at [phone] and follow the prompts.  Our office hours are 8:00 a.m. and 4:30 p.m. Monday - Friday.  Please note that voicemails left after 4:00 p.m. may not be returned until the following business day.  We are closed weekends and major holidays.  You do have access to a nurse 24-7, just call the main number to the clinic [phone] and do not press any options, hold on the line and a nurse will answer the phone.    For prescription refill requests, have your pharmacy contact our office and allow 72 hours.

## 2022-06-26 ENCOUNTER — Encounter: Payer: Self-pay | Admitting: Internal Medicine

## 2022-09-28 ENCOUNTER — Inpatient Hospital Stay: Payer: Medicare HMO | Attending: Physician Assistant

## 2022-09-28 DIAGNOSIS — D72828 Other elevated white blood cell count: Secondary | ICD-10-CM | POA: Insufficient documentation

## 2022-09-28 DIAGNOSIS — D72829 Elevated white blood cell count, unspecified: Secondary | ICD-10-CM | POA: Diagnosis not present

## 2022-09-28 DIAGNOSIS — I480 Paroxysmal atrial fibrillation: Secondary | ICD-10-CM | POA: Diagnosis not present

## 2022-09-28 DIAGNOSIS — E039 Hypothyroidism, unspecified: Secondary | ICD-10-CM | POA: Insufficient documentation

## 2022-09-28 DIAGNOSIS — Z1589 Genetic susceptibility to other disease: Secondary | ICD-10-CM

## 2022-09-28 DIAGNOSIS — E559 Vitamin D deficiency, unspecified: Secondary | ICD-10-CM

## 2022-09-28 LAB — URIC ACID: Uric Acid, Serum: 3.6 mg/dL (ref 2.5–7.1)

## 2022-09-28 LAB — CBC WITH DIFFERENTIAL/PLATELET
Abs Immature Granulocytes: 0.03 10*3/uL (ref 0.00–0.07)
Basophils Absolute: 0.1 10*3/uL (ref 0.0–0.1)
Basophils Relative: 1 %
Eosinophils Absolute: 0.1 10*3/uL (ref 0.0–0.5)
Eosinophils Relative: 1 %
HCT: 41.9 % (ref 36.0–46.0)
Hemoglobin: 13.5 g/dL (ref 12.0–15.0)
Immature Granulocytes: 0 %
Lymphocytes Relative: 22 %
Lymphs Abs: 2 10*3/uL (ref 0.7–4.0)
MCH: 29.9 pg (ref 26.0–34.0)
MCHC: 32.2 g/dL (ref 30.0–36.0)
MCV: 92.7 fL (ref 80.0–100.0)
Monocytes Absolute: 0.7 10*3/uL (ref 0.1–1.0)
Monocytes Relative: 8 %
Neutro Abs: 6.3 10*3/uL (ref 1.7–7.7)
Neutrophils Relative %: 68 %
Platelets: 352 10*3/uL (ref 150–400)
RBC: 4.52 MIL/uL (ref 3.87–5.11)
RDW: 13.2 % (ref 11.5–15.5)
WBC: 9.2 10*3/uL (ref 4.0–10.5)
nRBC: 0 % (ref 0.0–0.2)

## 2022-09-28 LAB — COMPREHENSIVE METABOLIC PANEL
ALT: 9 U/L (ref 0–44)
AST: 23 U/L (ref 15–41)
Albumin: 3.7 g/dL (ref 3.5–5.0)
Alkaline Phosphatase: 52 U/L (ref 38–126)
Anion gap: 9 (ref 5–15)
BUN: 9 mg/dL (ref 8–23)
CO2: 26 mmol/L (ref 22–32)
Calcium: 9.3 mg/dL (ref 8.9–10.3)
Chloride: 104 mmol/L (ref 98–111)
Creatinine, Ser: 0.82 mg/dL (ref 0.44–1.00)
GFR, Estimated: >60 mL/min (ref >60)
Glucose, Bld: 121 mg/dL — ABNORMAL HIGH (ref 70–99)
Potassium: 4 mmol/L (ref 3.5–5.1)
Sodium: 139 mmol/L (ref 135–145)
Total Bilirubin: 0.4 mg/dL (ref 0.3–1.2)
Total Protein: 6.5 g/dL (ref 6.5–8.1)

## 2022-09-28 LAB — LACTATE DEHYDROGENASE: LDH: 118 U/L (ref 98–192)

## 2022-09-28 LAB — VITAMIN D 25 HYDROXY (VIT D DEFICIENCY, FRACTURES): Vit D, 25-Hydroxy: 43.29 ng/mL (ref 30–100)

## 2022-10-05 ENCOUNTER — Inpatient Hospital Stay: Payer: Medicare HMO | Admitting: Physician Assistant

## 2022-10-05 VITALS — BP 153/54 | HR 76 | Temp 97.7°F | Resp 19 | Wt 114.4 lb

## 2022-10-05 DIAGNOSIS — Z1589 Genetic susceptibility to other disease: Secondary | ICD-10-CM

## 2022-10-05 DIAGNOSIS — I480 Paroxysmal atrial fibrillation: Secondary | ICD-10-CM | POA: Diagnosis not present

## 2022-10-05 DIAGNOSIS — D72829 Elevated white blood cell count, unspecified: Secondary | ICD-10-CM

## 2022-10-05 DIAGNOSIS — D72828 Other elevated white blood cell count: Secondary | ICD-10-CM | POA: Diagnosis not present

## 2022-10-05 DIAGNOSIS — E039 Hypothyroidism, unspecified: Secondary | ICD-10-CM | POA: Diagnosis not present

## 2022-10-05 NOTE — Patient Instructions (Addendum)
[facility] at [place] **   You were seen today by Tarri Abernethy PA-C for your elevated white blood cells.    Elevated white blood cells Your labs look great today! We will recheck these labs again in 6 months.  Elevated calcium Your calcium level has come back to normal. You can continue to take your Caltrate supplement, but should only take it ONCE daily.  FOLLOW-UP APPOINTMENT: Labs in 6 months with office visit 1 week after  ** Thank you for trusting me with your healthcare!  I strive to provide all of my patients with quality care at each visit.  If you receive a survey for this visit, I would be so grateful to you for taking the time to provide feedback.  Thank you in advance!  ~ Brycelynn Stampley                   Dr. Derek Jack   &   Tarri Abernethy, PA-C   - - - - - - - - - - - - - - - - - -    Thank you for choosing [place] at [facility] to provide your oncology and hematology care.  To afford each patient quality time with our provider, please arrive at least 15 minutes before your scheduled appointment time.   If you have a lab appointment with the [facility] please come in thru the Main Entrance and check in at the main information desk.  You need to re-schedule your appointment should you arrive 10 or more minutes late.  We strive to give you quality time with our providers, and arriving late affects you and other patients whose appointments are after yours.  Also, if you no show three or more times for appointments you may be dismissed from the clinic at the providers discretion.     Again, thank you for choosing [facility].  Our hope is that these requests will decrease the amount of time that you wait before being seen by our physicians.       _____________________________________________________________  Should you have questions after your visit to  [facility], please contact our office at [phone] and follow the prompts.  Our office hours are 8:00 a.m. and 4:30 p.m. Monday - Friday.  Please note that voicemails left after 4:00 p.m. may not be returned until the following business day.  We are closed weekends and major holidays.  You do have access to a nurse 24-7, just call the main number to the clinic [phone] and do not press any options, hold on the line and a nurse will answer the phone.    For prescription refill requests, have your pharmacy contact our office and allow 72 hours.

## 2022-10-05 NOTE — Progress Notes (Signed)
[place], [place] [address]   CLINIC:  Medical Oncology/Hematology  PCP:  Caryl Ada [place] [address] [phone]   REASON FOR VISIT:  Follow-up for JAK2+ leukocytosis and hypercalcemia   PRIOR THERAPY: None   CURRENT THERAPY: Surveillance  INTERVAL HISTORY:   Allison Koch 67 y.o. female returns for routine follow-up of JAK2+ leukocytosis and mild hypercalcemia.  She was last seen by Tarri Abernethy PA-C on 06/22/2022.  At today's visit, she reports feeling fairly well.   She denies any changes in her symptoms or baseline health status since her last visit.  She denies any recent infections or steroids.  She denies any fever, night sweats, weight loss, or chills.   She has not noticed any new lumps or bumps.  She is taking Caltrate supplement once daily.    She has 100% energy and 75% appetite. She endorses that she is maintaining a stable weight.   ASSESSMENT & PLAN:  1.  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 - She does not have any B symptoms/infections. - She denies any history of splenectomy.  No systemic steroids.  No history of connective tissue disorders. 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 Normal LDH, normal reticulocytes.  Rheumatoid factor and ANA negative.  Normal ESR and CRP. - Most recent CBC (09/28/2022): Normal WBC 9.2 with normal differential, overall normal CBC, normal LDH, normal uric acid - No palpable splenomegaly or lymphadenopathy on exam. - PLAN: No indication for cytoreductive therapy at this time.  We will continue close monitoring and consider starting Hydrea if HCT >45%, platelets >400, or WBC >20.0.  Repeat labs (CBC/differential, LDH, uric acid) and RTC in 6 months.   2.  Mild hypercalcemia - Mild  hypercalcemia intermittently since 2012 - SPEP unremarkable.  Vitamin D 25 and vitamin D 1, 25 both within normal limits. - Labs from 02/23/2022 show slightly low PTH at 10.  Normal calcium 10.3. - She previously took 2 tablets calcium/vitamin D daily, but decreased to 1 tablet daily in July 2023. - Most recent labs (09/28/2022): Normal calcium 9.3, normal vitamin D43.29. - PLAN: Suspect hypercalcemia secondary to calcium supplementation.  Patient instructed to continue Caltrate only ONCE daily.     3.  Other history - Lives at home with husband.  Retired from office type work.  No chemical exposure.  Non-smoker. - Mother had colon cancer.  Paternal aunt had cancer in her foot.  Maternal aunt had brain cancer.`  PLAN SUMMARY: >> Labs in 6 months (CBC/D, LDH, uric acid, CMP) >> OFFICE visit after labs     REVIEW OF SYSTEMS:   Review of Systems  Constitutional:  Negative for appetite change, chills, diaphoresis, fatigue, fever and unexpected weight change.  HENT:   Negative for lump/mass and nosebleeds.   Eyes:  Negative for eye problems.  Respiratory:  Negative for cough, hemoptysis and shortness of breath.   Cardiovascular:  Negative for chest pain, leg swelling and palpitations.  Gastrointestinal:  Negative for abdominal pain, blood in stool, constipation, diarrhea, nausea and vomiting.  Genitourinary:  Negative for hematuria.   Skin: Negative.   Neurological:  Negative for dizziness, headaches and light-headedness.  Hematological:  Does not bruise/bleed easily.     PHYSICAL EXAM:  ECOG PERFORMANCE STATUS: 0 - Asymptomatic  Vitals:   10/05/22 0950  BP: (!) 153/54  Pulse: 76  Resp: 19  Temp: 97.7 F (36.5 C)  SpO2: 100%   Filed Weights   10/05/22 0950  Weight: 114 lb 6.7 oz (51.9 kg)   Physical Exam Constitutional:      Appearance: Normal appearance.  HENT:     Head: Normocephalic and atraumatic.     Mouth/Throat:     Mouth: Mucous membranes are moist.  Eyes:      Extraocular Movements: Extraocular movements intact.     Pupils: Pupils are equal, round, and reactive to light.  Cardiovascular:     Rate and Rhythm: Normal rate and regular rhythm.     Pulses: Normal pulses.     Heart sounds: Normal heart sounds.  Pulmonary:     Effort: Pulmonary effort is normal.     Breath sounds: Normal breath sounds.  Abdominal:     General: Bowel sounds are normal.     Palpations: Abdomen is soft. There is no splenomegaly.     Tenderness: There is no abdominal tenderness.  Musculoskeletal:        General: No swelling.     Right lower leg: No edema.     Left lower leg: No edema.  Lymphadenopathy:     Head:     Right side of head: No submental, submandibular, tonsillar, preauricular, posterior auricular or occipital adenopathy.     Left side of head: No submental, submandibular, tonsillar, preauricular, posterior auricular or occipital adenopathy.     Cervical: No cervical adenopathy.     Right cervical: No superficial, deep or posterior cervical adenopathy.    Left cervical: No superficial, deep or posterior cervical adenopathy.     Upper Body:     Right upper body: No supraclavicular adenopathy.     Left upper body: No supraclavicular adenopathy.  Skin:    General: Skin is warm and dry.  Neurological:     General: No focal deficit present.     Mental Status: She is alert and oriented to person, place, and time.  Psychiatric:        Mood and Affect: Mood normal.        Behavior: Behavior normal.     PAST MEDICAL/SURGICAL HISTORY:  Past Medical History:  Diagnosis Date   Hypothyroidism    PAF (paroxysmal atrial fibrillation) (HCC)    Noted in setting of acute illness   Tubular adenoma 07/2019   colonoscopy, Dr. Zenovia Jarred   Past Surgical History:  Procedure Laterality Date   COLONOSCOPY  07/2019   Dr. Zenovia Jarred, tubular adenoma, repeat 5 years   EYE SURGERY     TONSILLECTOMY     UTERINE FIBROID SURGERY     WISDOM TOOTH EXTRACTION       SOCIAL HISTORY:  Social History   Socioeconomic History   Marital status: Married    Spouse name: Not on file   Number of children: Not on file   Years of education: Not on file   Highest education level: Not on file  Occupational History   Not on file  Tobacco Use   Smoking status: Never   Smokeless tobacco: Never  Vaping Use   Vaping Use: Never used  Substance and Sexual Activity   Alcohol use: No   Drug use: No   Sexual activity: Not on file  Other Topics Concern   Not on file  Social History Narrative   Chiropractor 4H camp, [place]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   Social Determinants of Health   Financial Resource Strain: Low Risk  (  01/05/2022)   Overall Financial Resource Strain (CARDIA)    Difficulty of Paying Living Expenses: Not hard at all  Food Insecurity: No Food Insecurity (01/05/2022)   Hunger Vital Sign    Worried About Running Out of Food in the Last Year: Never true    Ran Out of Food in the Last Year: Never true  Transportation Needs: No Transportation Needs (01/05/2022)   PRAPARE - Hydrologist (Medical): No    Lack of Transportation (Non-Medical): No  Physical Activity: Insufficiently Active (01/05/2022)   Exercise Vital Sign    Days of Exercise per Week: 7 days    Minutes of Exercise per Session: 20 min  Stress: No Stress Concern Present (01/05/2022)   [place]    Feeling of Stress : Not at all  Social Connections: Not on file  Intimate Partner Violence: Not on file    FAMILY HISTORY:  Family History  Problem Relation Age of Onset   Arrhythmia Mother        atrial fib   Colon cancer Mother 9   Dementia Mother    COPD Mother    Cancer Mother    Heart attack Father        CABG x9, maybe in 60s   Heart failure Father        died of CHF   Diabetes Father    Esophageal cancer Neg Hx    Rectal cancer Neg Hx    Stomach cancer Neg Hx    Heart disease Neg Hx      CURRENT MEDICATIONS:  Outpatient Encounter Medications as of 10/05/2022  Medication Sig Note   Calcium Carbonate-Vitamin D 600-400 MG-UNIT per chew tablet Chew 1 tablet by mouth 2 (two) times daily.    estradiol (VIVELLE-DOT) 0.05 MG/24HR patch estradiol 0.05 mg/24 hr semiweekly transdermal patch  APPLY ONE PATCH ON THE SKIN TWICE A WEEK 01/17/2022: Changed mg 9.05 mg patch   levothyroxine (SYNTHROID) 75 MCG tablet TAKE 1 TABLET BY MOUTH EVERY DAY BEFORE BREAKFAST    progesterone (PROMETRIUM) 100 MG capsule Take 100 mg by mouth at bedtime.    No facility-administered encounter medications on file as of 10/05/2022.    ALLERGIES:  Allergies  Allergen Reactions   Other     Mycins per patient.   Penicillins    Sulfa Antibiotics Other (See Comments)    LABORATORY DATA:  I have reviewed the labs as listed.  CBC    Component Value Date/Time   WBC 9.2 09/28/2022 1055   RBC 4.52 09/28/2022 1055   HGB 13.5 09/28/2022 1055   HGB 14.2 01/17/2022 1106   HCT 41.9 09/28/2022 1055   HCT 42.4 01/17/2022 1106   PLT 352 09/28/2022 1055   PLT 376 01/17/2022 1106   MCV 92.7 09/28/2022 1055   MCV 89 01/17/2022 1106   MCH 29.9 09/28/2022 1055   MCHC 32.2 09/28/2022 1055   RDW 13.2 09/28/2022 1055   RDW 12.4 01/17/2022 1106   LYMPHSABS 2.0 09/28/2022 1055   LYMPHSABS 1.9 01/17/2022 1106   MONOABS 0.7 09/28/2022 1055   EOSABS 0.1 09/28/2022 1055   EOSABS 0.1 01/17/2022 1106   BASOSABS 0.1 09/28/2022 1055   BASOSABS 0.1 01/17/2022 1106      Latest Ref Rng & Units 09/28/2022   10:55 AM 02/23/2022   11:47 AM 01/17/2022   11:06 AM  CMP  Glucose 70 - 99 mg/dL 121   84   BUN  8 - 23 mg/dL 9   10   Creatinine 0.44 - 1.00 mg/dL 0.82   0.83   Sodium 135 - 145 mmol/L 139   140   Potassium 3.5 - 5.1 mmol/L 4.0   4.1   Chloride 98 - 111 mmol/L 104   101   CO2 22 - 32 mmol/L 26   24   Calcium 8.9 - 10.3 mg/dL 9.3  10.3  11.0   Total Protein 6.5 - 8.1 g/dL 6.5   6.8   Total Bilirubin 0.3 - 1.2  mg/dL 0.4   1.0   Alkaline Phos 38 - 126 U/L 52   68   AST 15 - 41 U/L 23   19   ALT 0 - 44 U/L 9   9     DIAGNOSTIC IMAGING:  I have independently reviewed the relevant imaging and discussed with the patient.   WRAP UP:  All questions were answered. The patient knows to call the clinic with any problems, questions or concerns.  Medical decision making: Low  Time spent on visit: I spent 15 minutes counseling the patient face to face. The total time spent in the appointment was 22 minutes and more than 50% was on counseling.  Harriett Rush, PA-C  10/05/22 10:31 AM

## 2022-10-08 ENCOUNTER — Other Ambulatory Visit: Payer: Self-pay

## 2022-10-08 DIAGNOSIS — Z1589 Genetic susceptibility to other disease: Secondary | ICD-10-CM

## 2022-10-08 DIAGNOSIS — E559 Vitamin D deficiency, unspecified: Secondary | ICD-10-CM

## 2022-10-08 DIAGNOSIS — D72829 Elevated white blood cell count, unspecified: Secondary | ICD-10-CM

## 2023-01-02 ENCOUNTER — Telehealth: Payer: Self-pay | Admitting: Medical

## 2023-01-02 NOTE — Telephone Encounter (Signed)
Contacted Alphonsa Gin to schedule their annual wellness visit. Appointment made for 01/15/23.  Rudell Cobb AWV direct phone # [phone]    Due to a schedule change, we have had to reschedule your medicare wellness visit from  01/11/23  to 01/15/23    Patient aware of appt date/time change 4/17

## 2023-01-09 NOTE — Progress Notes (Unsigned)
    Cardiology Office Note  Date: 01/10/2023   ID: Shelley, Cocke [birth year], MRN [id number]  History of Present Illness: Allison Koch is a 67 y.o. female last seen in March 2022.  She is here for a follow-up visit.  Reports no symptoms with exertion, no palpitations or dizziness.  She tells me that her mother passed away back in 09/17/23, she is dealing with estate issues at this time and has been under a lot of stress.  I reviewed her ECG which shows sinus rhythm with nonspecific ST changes.  She is due for lab work this year, continues to follow with Mr. Tysinger PA-C.  Physical Exam: VS:  BP (!) 144/60   Pulse 72   Ht  (1.651 m)   Wt 112 lb 3.2 oz (50.9 kg)   SpO2 100%   BMI 18.67 kg/m , BMI Body mass index is 18.67 kg/m.  Wt Readings from Last 3 Encounters:  01/10/23 112 lb 3.2 oz (50.9 kg)  10/05/22 114 lb 6.7 oz (51.9 kg)  06/22/22 117 lb 3.2 oz (53.2 kg)    General: Patient appears comfortable at rest. HEENT: Conjunctiva and lids normal. Neck: Supple, no elevated JVP, bilateral carotid bruits. Lungs: Clear to auscultation, nonlabored breathing at rest. Cardiac: Regular rate and rhythm, no S3 or significant systolic murmur. Extremities: No pitting edema.  ECG:  An ECG dated 11/18/2020 was personally reviewed today and demonstrated:  Sinus rhythm.  Labwork: 01/17/2022: TSH 1.580 09/28/2022: ALT 9; AST 23; BUN 9; Creatinine, Ser 0.82; Hemoglobin 13.5; Platelets 352; Potassium 4.0; Sodium 139     Component Value Date/Time   CHOL 210 (H) 01/17/2022 1106   TRIG 125 01/17/2022 1106   HDL 57 01/17/2022 1106   CHOLHDL 3.7 01/17/2022 1106   LDLCALC 131 (H) 01/17/2022 1106   LDLDIRECT 131 (H) 06/30/2020 1355   Other Studies Reviewed Today:  No interval cardiac testing for review today.  Assessment and Plan:  1.  History of atrial fibrillation in setting of acute illness without obvious recurrence.  Remains asymptomatic and is in sinus rhythm by ECG today.   Continue with observation.  Have discussed possibility of anticoagulation ultimately if rhythm recurrence is documented.  2.  Bilateral carotid bruits.  Will obtain carotid Dopplers for further evaluation.  If vascular disease documented, would need to consider statin therapy particularly in light of her baseline LDL of 131 last year.  3.  Hypothyroidism, on Synthroid with follow-up by PCP.  TSH normal in May 2023.  Disposition:  Follow up  1 year.  Signed, Jonelle Sidle, M.D., F.A.C.C. [facility]

## 2023-01-10 ENCOUNTER — Encounter: Payer: Self-pay | Admitting: Cardiology

## 2023-01-10 ENCOUNTER — Ambulatory Visit: Payer: Medicare HMO | Attending: Cardiology | Admitting: Cardiology

## 2023-01-10 VITALS — BP 144/60 | HR 72 | Ht 65.0 in | Wt 112.2 lb

## 2023-01-10 DIAGNOSIS — R0989 Other specified symptoms and signs involving the circulatory and respiratory systems: Secondary | ICD-10-CM

## 2023-01-10 DIAGNOSIS — Z8679 Personal history of other diseases of the circulatory system: Secondary | ICD-10-CM

## 2023-01-10 NOTE — Patient Instructions (Signed)
Medication Instructions:  Your physician recommends that you continue on your current medications as directed. Please refer to the Current Medication list given to you today.   Labwork: None today  Testing/Procedures: Your physician has requested that you have a carotid duplex. This test is an ultrasound of the carotid arteries in your neck. It looks at blood flow through these arteries that supply the brain with blood. Allow one hour for this exam. There are no restrictions or special instructions.   Follow-Up: 1 year  Any Other Special Instructions Will Be Listed Below (If Applicable).  If you need a refill on your cardiac medications before your next appointment, please call your pharmacy.  

## 2023-01-11 ENCOUNTER — Ambulatory Visit: Payer: Medicare Other

## 2023-01-14 ENCOUNTER — Other Ambulatory Visit: Payer: Self-pay | Admitting: Medical

## 2023-01-15 ENCOUNTER — Ambulatory Visit (INDEPENDENT_AMBULATORY_CARE_PROVIDER_SITE_OTHER): Payer: Medicare HMO

## 2023-01-15 VITALS — Ht 65.0 in | Wt 112.0 lb

## 2023-01-15 DIAGNOSIS — Z0000 Encounter for general adult medical examination without abnormal findings: Secondary | ICD-10-CM | POA: Diagnosis not present

## 2023-01-15 NOTE — Patient Instructions (Addendum)
[name] , Thank you for taking time to come for your Medicare Wellness Visit. I appreciate your ongoing commitment to your health goals. Please review the following plan we discussed and let me know if I can assist you in the future.   These are the goals we discussed:  Goals      Patient Stated     01/05/2022, stay healthy and drink more water     Patient Stated     4/230/2024, stay healthy        This is a list of the screening recommended for you and due dates:  Health Maintenance  Topic Date Due   DTaP/Tdap/Td vaccine (1 - Tdap) Never done   Zoster (Shingles) Vaccine (1 of 2) Never done   Pneumonia Vaccine (1 of 1 - PCV) Never done   COVID-19 Vaccine (4 - 2023-24 season) 05/18/2022   Flu Shot  04/18/2023   Medicare Annual Wellness Visit  01/15/2024   Mammogram  04/09/2024   Colon Cancer Screening  07/21/2024   DEXA scan (bone density measurement)  Completed   HPV Vaccine  Aged Out   Hepatitis C Screening: USPSTF Recommendation to screen - Ages 18-79 yo.  Discontinued    Advanced directives: Please bring a copy of your POA (Power of Attorney) and/or Living Will to your next appointment.   Conditions/risks identified: none  Next appointment: Follow up in one year for your annual wellness visit    Preventive Care 65 Years and Older, Female Preventive care refers to lifestyle choices and visits with your health care provider that can promote health and wellness. What does preventive care include? A yearly physical exam. This is also called an annual well check. Dental exams once or twice a year. Routine eye exams. Ask your health care provider how often you should have your eyes checked. Personal lifestyle choices, including: Daily care of your teeth and gums. Regular physical activity. Eating a healthy diet. Avoiding tobacco and drug use. Limiting alcohol use. Practicing safe sex. Taking low-dose aspirin every day. Taking vitamin and mineral supplements as  recommended by your health care provider. What happens during an annual well check? The services and screenings done by your health care provider during your annual well check will depend on your age, overall health, lifestyle risk factors, and family history of disease. Counseling  Your health care provider may ask you questions about your: Alcohol use. Tobacco use. Drug use. Emotional well-being. Home and relationship well-being. Sexual activity. Eating habits. History of falls. Memory and ability to understand (cognition). Work and work Astronomer. Reproductive health. Screening  You may have the following tests or measurements: Height, weight, and BMI. Blood pressure. Lipid and cholesterol levels. These may be checked every 5 years, or more frequently if you are over 26 years old. Skin check. Lung cancer screening. You may have this screening every year starting at age 70 if you have a 30-pack-year history of smoking and currently smoke or have quit within the past 15 years. Fecal occult blood test (FOBT) of the stool. You may have this test every year starting at age 2. Flexible sigmoidoscopy or colonoscopy. You may have a sigmoidoscopy every 5 years or a colonoscopy every 10 years starting at age 63. Hepatitis C blood test. Hepatitis B blood test. Sexually transmitted disease (STD) testing. Diabetes screening. This is done by checking your blood sugar (glucose) after you have not eaten for a while (fasting). You may have this done every 1-3 years. Bone density scan.  This is done to screen for osteoporosis. You may have this done starting at age 81. Mammogram. This may be done every 1-2 years. Talk to your health care provider about how often you should have regular mammograms. Talk with your health care provider about your test results, treatment options, and if necessary, the need for more tests. Vaccines  Your health care provider may recommend certain vaccines, such  as: Influenza vaccine. This is recommended every year. Tetanus, diphtheria, and acellular pertussis (Tdap, Td) vaccine. You may need a Td booster every 10 years. Zoster vaccine. You may need this after age 73. Pneumococcal 13-valent conjugate (PCV13) vaccine. One dose is recommended after age 78. Pneumococcal polysaccharide (PPSV23) vaccine. One dose is recommended after age 18. Talk to your health care provider about which screenings and vaccines you need and how often you need them. This information is not intended to replace advice given to you by your health care provider. Make sure you discuss any questions you have with your health care provider. Document Released: 09/30/2015 Document Revised: 05/23/2016 Document Reviewed: 07/05/2015 Elsevier Interactive Patient Education  2017 [place] Prevention in the Home Falls can cause injuries. They can happen to people of all ages. There are many things you can do to make your home safe and to help prevent falls. What can I do on the outside of my home? Regularly fix the edges of walkways and driveways and fix any cracks. Remove anything that might make you trip as you walk through a door, such as a raised step or threshold. Trim any bushes or trees on the path to your home. Use bright outdoor lighting. Clear any walking paths of anything that might make someone trip, such as rocks or tools. Regularly check to see if handrails are loose or broken. Make sure that both sides of any steps have handrails. Any raised decks and porches should have guardrails on the edges. Have any leaves, snow, or ice cleared regularly. Use sand or salt on walking paths during winter. Clean up any spills in your garage right away. This includes oil or grease spills. What can I do in the bathroom? Use night lights. Install grab bars by the toilet and in the tub and shower. Do not use towel bars as grab bars. Use non-skid mats or decals in the tub or  shower. If you need to sit down in the shower, use a plastic, non-slip stool. Keep the floor dry. Clean up any water that spills on the floor as soon as it happens. Remove soap buildup in the tub or shower regularly. Attach bath mats securely with double-sided non-slip rug tape. Do not have throw rugs and other things on the floor that can make you trip. What can I do in the bedroom? Use night lights. Make sure that you have a light by your bed that is easy to reach. Do not use any sheets or blankets that are too big for your bed. They should not hang down onto the floor. Have a firm chair that has side arms. You can use this for support while you get dressed. Do not have throw rugs and other things on the floor that can make you trip. What can I do in the kitchen? Clean up any spills right away. Avoid walking on wet floors. Keep items that you use a lot in easy-to-reach places. If you need to reach something above you, use a strong step stool that has a grab bar. Keep electrical cords  out of the way. Do not use floor polish or wax that makes floors slippery. If you must use wax, use non-skid floor wax. Do not have throw rugs and other things on the floor that can make you trip. What can I do with my stairs? Do not leave any items on the stairs. Make sure that there are handrails on both sides of the stairs and use them. Fix handrails that are broken or loose. Make sure that handrails are as long as the stairways. Check any carpeting to make sure that it is firmly attached to the stairs. Fix any carpet that is loose or worn. Avoid having throw rugs at the top or bottom of the stairs. If you do have throw rugs, attach them to the floor with carpet tape. Make sure that you have a light switch at the top of the stairs and the bottom of the stairs. If you do not have them, ask someone to add them for you. What else can I do to help prevent falls? Wear shoes that: Do not have high heels. Have  rubber bottoms. Are comfortable and fit you well. Are closed at the toe. Do not wear sandals. If you use a stepladder: Make sure that it is fully opened. Do not climb a closed stepladder. Make sure that both sides of the stepladder are locked into place. Ask someone to hold it for you, if possible. Clearly mark and make sure that you can see: Any grab bars or handrails. First and last steps. Where the edge of each step is. Use tools that help you move around (mobility aids) if they are needed. These include: Canes. Walkers. Scooters. Crutches. Turn on the lights when you go into a dark area. Replace any light bulbs as soon as they burn out. Set up your furniture so you have a clear path. Avoid moving your furniture around. If any of your floors are uneven, fix them. If there are any pets around you, be aware of where they are. Review your medicines with your doctor. Some medicines can make you feel dizzy. This can increase your chance of falling. Ask your doctor what other things that you can do to help prevent falls. This information is not intended to replace advice given to you by your health care provider. Make sure you discuss any questions you have with your health care provider. Document Released: 06/30/2009 Document Revised: 02/09/2016 Document Reviewed: 10/08/2014 Elsevier Interactive Patient Education  2017 [employer].

## 2023-01-15 NOTE — Progress Notes (Signed)
I connected with  Allison Koch on 01/15/23 by a audio enabled telemedicine application and verified that I am speaking with the correct person using two identifiers.  Patient Location: Home  Provider Location: Office/Clinic  I discussed the limitations of evaluation and management by telemedicine. The patient expressed understanding and agreed to proceed.  Subjective:   Allison Koch is a 67 y.o. female who presents for Medicare Annual (Subsequent) preventive examination.  Review of Systems     Cardiac Risk Factors include: advanced age (>61men, >42 women)     Objective:    Today's Vitals   01/15/23 1500  Weight: 112 lb (50.8 kg)  Height: 5\' 5"  (1.651 m)   Body mass index is 18.64 kg/m.     01/15/2023    3:03 PM 06/22/2022   11:02 AM 03/21/2022    9:32 AM 02/23/2022   11:27 AM 01/05/2022    3:02 PM 08/23/2011    9:33 AM 08/22/2011   12:30 PM  Advanced Directives  Does Patient Have a Medical Advance Directive? Yes Yes Yes No Yes Patient does not have advance directive;Patient would not like information Patient does not have advance directive;Patient would not like information  Type of Public librarian Power of [place];Living will Healthcare Power of [place];Living will Healthcare Power of [place];Living will  Healthcare Power of Attorney    Does patient want to make changes to medical advance directive?  No - Patient declined No - Patient declined No - Patient declined     Copy of Healthcare Power of Attorney in Chart? No - copy requested No - copy requested   No - copy requested    Would patient like information on creating a medical advance directive?  No - Patient declined No - Patient declined No - Patient declined     Pre-existing out of facility DNR order (yellow form or pink MOST form)      No No    Current Medications (verified) Outpatient Encounter Medications as of 01/15/2023  Medication Sig   Calcium Carbonate-Vitamin D 600-400 MG-UNIT per chew tablet  Chew 1 tablet by mouth 2 (two) times daily.   estradiol (VIVELLE-DOT) 0.05 MG/24HR patch estradiol 0.05 mg/24 hr semiweekly transdermal patch  APPLY ONE PATCH ON THE SKIN TWICE A WEEK   levothyroxine (SYNTHROID) 75 MCG tablet TAKE 1 TABLET BY MOUTH EVERY DAY BEFORE BREAKFAST   progesterone (PROMETRIUM) 100 MG capsule Take 100 mg by mouth at bedtime.   No facility-administered encounter medications on file as of 01/15/2023.    Allergies (verified) Other, Penicillins, and Sulfa antibiotics   History: Past Medical History:  Diagnosis Date   Hypothyroidism    PAF (paroxysmal atrial fibrillation) (HCC)    Noted in setting of acute illness   Tubular adenoma 07/2019   colonoscopy, Dr. Erick Blinks   Past Surgical History:  Procedure Laterality Date   COLONOSCOPY  07/2019   Dr. Erick Blinks, tubular adenoma, repeat 5 years   EYE SURGERY     TONSILLECTOMY     UTERINE FIBROID SURGERY     WISDOM TOOTH EXTRACTION     Family History  Problem Relation Age of Onset   Arrhythmia Mother        atrial fib   Colon cancer Mother 46   Dementia Mother    COPD Mother    Cancer Mother    Heart attack Father        CABG x9, maybe in 52s   Heart failure Father  died of CHF   Diabetes Father    Esophageal cancer Neg Hx    Rectal cancer Neg Hx    Stomach cancer Neg Hx    Heart disease Neg Hx    Social History   Socioeconomic History   Marital status: Married    Spouse name: Not on file   Number of children: Not on file   Years of education: Not on file   Highest education level: Not on file  Occupational History   Not on file  Tobacco Use   Smoking status: Never   Smokeless tobacco: Never  Vaping Use   Vaping Use: Never used  Substance and Sexual Activity   Alcohol use: No   Drug use: No   Sexual activity: Not on file  Other Topics Concern   Not on file  Social History Narrative   Advertising account executive 4H camp, [place]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   Social Determinants of Health   Financial  Resource Strain: Low Risk  (01/15/2023)   Overall Financial Resource Strain (CARDIA)    Difficulty of Paying Living Expenses: Not hard at all  Food Insecurity: No Food Insecurity (01/15/2023)   Hunger Vital Sign    Worried About Running Out of Food in the Last Year: Never true    Ran Out of Food in the Last Year: Never true  Transportation Needs: No Transportation Needs (01/15/2023)   PRAPARE - Administrator, Civil Service (Medical): No    Lack of Transportation (Non-Medical): No  Physical Activity: Sufficiently Active (01/15/2023)   Exercise Vital Sign    Days of Exercise per Week: 4 days    Minutes of Exercise per Session: 60 min  Stress: No Stress Concern Present (01/15/2023)   [employer] of Occupational Health - Occupational Stress Questionnaire    Feeling of Stress : Not at all  Social Connections: Not on file    Tobacco Counseling Counseling given: Not Answered   Clinical Intake:  Pre-visit preparation completed: Yes  Pain : No/denies pain     Nutritional Status: BMI of 19-24  Normal Nutritional Risks: None Diabetes: No  How often do you need to have someone help you when you read instructions, pamphlets, or other written materials from your doctor or pharmacy?: 1 - Never  Diabetic? no  Interpreter Needed?: No  Information entered by :: NAllen LPN   Activities of Daily Living    01/15/2023    3:05 PM  In your present state of health, do you have any difficulty performing the following activities:  Hearing? 0  Vision? 0  Difficulty concentrating or making decisions? 0  Walking or climbing stairs? 0  Dressing or bathing? 0  Doing errands, shopping? 0  Preparing Food and eating ? N  Using the Toilet? N  In the past six months, have you accidently leaked urine? N  Do you have problems with loss of bowel control? N  Managing your Medications? N  Managing your Finances? N  Housekeeping or managing your Housekeeping? N    Patient Care  Team: Tysinger, Kermit Balo, PA-C as PCP - General (Family Medicine) Jonelle Sidle, MD as PCP - Cardiology (Cardiology) Doreatha Massed, MD as Medical Oncologist (Hematology) Richarda Overlie, MD as Consulting Physician (Obstetrics and Gynecology)  Indicate any recent Medical Services you may have received from other than [facility] providers in the past year (date may be approximate).     Assessment:   This is a routine wellness examination for [employer].  Hearing/Vision screen Vision  Screening - Comments:: Regular eye exams, [facility]  Dietary issues and exercise activities discussed: Current Exercise Habits: Home exercise routine, Type of exercise: walking, Time (Minutes): 60, Frequency (Times/Week): 4, Weekly Exercise (Minutes/Week): 240   Goals Addressed             This Visit's Progress    Patient Stated       4/230/2024, stay healthy       Depression Screen    01/15/2023    3:04 PM 05/28/2022    9:46 AM 01/17/2022   10:32 AM 01/05/2022    3:04 PM 06/30/2020   12:05 PM  PHQ 2/9 Scores  PHQ - 2 Score 0 0 0 0 0  PHQ- 9 Score 0   0 0    Fall Risk    01/15/2023    3:04 PM 05/28/2022    9:46 AM 01/17/2022   10:32 AM 01/05/2022    3:03 PM 01/09/2021    2:10 PM  Fall Risk   Falls in the past year? 0 0 0 0 0  Number falls in past yr: 0 0 0 0 0  Injury with Fall? 0 0 0 0 0  Risk for fall due to : Medication side effect No Fall Risks No Fall Risks Medication side effect No Fall Risks  Follow up Falls prevention discussed;Education provided;Falls evaluation completed Falls evaluation completed Falls evaluation completed Falls evaluation completed;Education provided;Falls prevention discussed Falls evaluation completed    FALL RISK PREVENTION PERTAINING TO THE HOME:  Any stairs in or around the home? Yes  If so, are there any without handrails? No  Home free of loose throw rugs in walkways, pet beds, electrical cords, etc? Yes  Adequate lighting in your home to reduce  risk of falls? Yes   ASSISTIVE DEVICES UTILIZED TO PREVENT FALLS:  Life alert? No  Use of a cane, walker or w/c? No  Grab bars in the bathroom? No  Shower chair or bench in shower? No  Elevated toilet seat or a handicapped toilet? Yes   TIMED UP AND GO:  Was the test performed? No .      Cognitive Function:        01/15/2023    3:06 PM 01/05/2022    3:05 PM  6CIT Screen  What Year? 0 points 0 points  What month? 0 points 0 points  What time? 0 points 0 points  Count back from 20 0 points 0 points  Months in reverse 0 points 0 points  Repeat phrase 0 points 0 points  Total Score 0 points 0 points    Immunizations Immunization History  Administered Date(s) Administered   PFIZER(Purple Top)SARS-COV-2 Vaccination 03/24/2020, 04/15/2020    TDAP status: Due, Education has been provided regarding the importance of this vaccine. Advised may receive this vaccine at local pharmacy or Health Dept. Aware to provide a copy of the vaccination record if obtained from local pharmacy or Health Dept. Verbalized acceptance and understanding.  Flu Vaccine status: Declined, Education has been provided regarding the importance of this vaccine but patient still declined. Advised may receive this vaccine at local pharmacy or Health Dept. Aware to provide a copy of the vaccination record if obtained from local pharmacy or Health Dept. Verbalized acceptance and understanding.  Pneumococcal vaccine status: Declined,  Education has been provided regarding the importance of this vaccine but patient still declined. Advised may receive this vaccine at local pharmacy or Health Dept. Aware to provide a copy of the vaccination record  if obtained from local pharmacy or Health Dept. Verbalized acceptance and understanding.   Covid-19 vaccine status: Completed vaccines  Qualifies for Shingles Vaccine? Yes   Zostavax completed No   Shingrix Completed?: No.    Education has been provided regarding the  importance of this vaccine. Patient has been advised to call insurance company to determine out of pocket expense if they have not yet received this vaccine. Advised may also receive vaccine at local pharmacy or Health Dept. Verbalized acceptance and understanding.  Screening Tests Health Maintenance  Topic Date Due   DTaP/Tdap/Td (1 - Tdap) Never done   Zoster Vaccines- Shingrix (1 of 2) Never done   Pneumonia Vaccine 63+ Years old (1 of 1 - PCV) Never done   COVID-19 Vaccine (4 - 2023-24 season) 05/18/2022   Medicare Annual Wellness (AWV)  01/06/2023   INFLUENZA VACCINE  04/18/2023   MAMMOGRAM  04/09/2024   COLONOSCOPY (Pts 45-24yrs Insurance coverage will need to be confirmed)  07/21/2024   DEXA SCAN  Completed   HPV VACCINES  Aged Out   Hepatitis C Screening  Discontinued    Health Maintenance  Health Maintenance Due  Topic Date Due   DTaP/Tdap/Td (1 - Tdap) Never done   Zoster Vaccines- Shingrix (1 of 2) Never done   Pneumonia Vaccine 10+ Years old (1 of 1 - PCV) Never done   COVID-19 Vaccine (4 - 2023-24 season) 05/18/2022   Medicare Annual Wellness (AWV)  01/06/2023    Colorectal cancer screening: Type of screening: Colonoscopy. Completed 07/22/2019. Repeat every 5 years  Mammogram status: Completed 04/09/2022. Repeat every year  Bone Density status: Completed 04/09/2022.   Lung Cancer Screening: (Low Dose CT Chest recommended if Age 52-80 years, 30 pack-year currently smoking OR have quit w/in 15years.) does not qualify.   Lung Cancer Screening Referral: no  Additional Screening:  Hepatitis C Screening: does not qualify;   Vision Screening: Recommended annual ophthalmology exams for early detection of glaucoma and other disorders of the eye. Is the patient up to date with their annual eye exam?  Yes  Who is the provider or what is the name of the office in which the patient attends annual eye exams? [facility] If pt is not established with a provider, would they  like to be referred to a provider to establish care? No .   Dental Screening: Recommended annual dental exams for proper oral hygiene  Community Resource Referral / Chronic Care Management: CRR required this visit?  No   CCM required this visit?  No      Plan:     I have personally reviewed and noted the following in the patient's chart:   Medical and social history Use of alcohol, tobacco or illicit drugs  Current medications and supplements including opioid prescriptions. Patient is not currently taking opioid prescriptions. Functional ability and status Nutritional status Physical activity Advanced directives List of other physicians Hospitalizations, surgeries, and ER visits in previous 12 months Vitals Screenings to include cognitive, depression, and falls Referrals and appointments  In addition, I have reviewed and discussed with patient certain preventive protocols, quality metrics, and best practice recommendations. A written personalized care plan for preventive services as well as general preventive health recommendations were provided to patient.     Barb Merino, LPN   [id number]/84/[id number]   Nurse Notes: none  Due to this being a virtual visit, the after visit summary with patients personalized plan was offered to patient via mail or my-chart. Patient  would like to access on my-chart

## 2023-01-23 ENCOUNTER — Ambulatory Visit (HOSPITAL_COMMUNITY): Payer: Medicare HMO

## 2023-01-25 ENCOUNTER — Telehealth (INDEPENDENT_AMBULATORY_CARE_PROVIDER_SITE_OTHER): Payer: 59 | Admitting: Medical

## 2023-01-25 VITALS — Temp 97.0°F | Wt 112.0 lb

## 2023-01-25 DIAGNOSIS — R051 Acute cough: Secondary | ICD-10-CM

## 2023-01-25 DIAGNOSIS — H5789 Other specified disorders of eye and adnexa: Secondary | ICD-10-CM | POA: Diagnosis not present

## 2023-01-25 DIAGNOSIS — J988 Other specified respiratory disorders: Secondary | ICD-10-CM

## 2023-01-25 DIAGNOSIS — R509 Fever, unspecified: Secondary | ICD-10-CM | POA: Diagnosis not present

## 2023-01-25 MED ORDER — PROMETHAZINE-DM 6.25-15 MG/5ML PO SYRP: 5.0000 mL | ORAL_SOLUTION | Freq: Four times a day (QID) | ORAL | 0 refills | Status: DC | PRN

## 2023-01-25 MED ORDER — AZITHROMYCIN 250 MG PO TABS: ORAL_TABLET | ORAL | 0 refills | Status: DC

## 2023-01-25 NOTE — Progress Notes (Signed)
Subjective:     Patient ID: Allison Koch, female   DOB: 02/25/1956, 67 y.o.   MRN: [id number]  This visit type was conducted due to national recommendations for restrictions regarding the COVID-19 Pandemic (e.g. social distancing) in an effort to limit this patient's exposure and mitigate transmission in our community.  Due to their co-morbid illnesses, this patient is at least at moderate risk for complications without adequate follow up.  This format is felt to be most appropriate for this patient at this time.    Documentation for virtual audio and video telecommunications through [place] encounter:  The patient was located at home. The provider was located in the office. The patient did consent to this visit and is aware of possible charges through their insurance for this visit.  The other persons participating in this telemedicine service were none. Time spent on call was 20 minutes and in review of previous records 20 minutes total.  This virtual service is not related to other E/M service within previous 7 days.   HPI Chief Complaint  Patient presents with   dry cough    Dry cough, temp last night 102.4, start first of week, taken OTC allergies and cold. Having mucous in eye and cough are main concern. Has trouble swallowing medication bigger than a small advil tablet   Virtual consult for illness.  She notes about a week of symptoms.  Started with a dry cough this past weekend that has persisted and now its constant hacking cough.  She also has some mucus crusting in the eyes.  No history of allergy problems in the spring so she does not think it is allergy related.  She came down with a fever last night.  She used some Advil to help with the fever.  So far today her temperature was normal but is starting to increase back again.  She notes mild scratchy throat, some pressure in the head, hoarse for the last few days, but no ear pain, noted headache, no nausea vomiting or  diarrhea, no shortness of breath or wheezing.  She has not done a COVID test.  No sick contacts.  No recent travel.  She has taken 1 dose of Mucinex DM but it made her mouth really dry.  No other treatment so far.  No other aggravating or relieving factors. No other complaint.   Past Medical History:  Diagnosis Date   Hypothyroidism    PAF (paroxysmal atrial fibrillation) (HCC)    Noted in setting of acute illness   Tubular adenoma 07/2019   colonoscopy, Dr. Erick Blinks   Current Outpatient Medications on File Prior to Visit  Medication Sig Dispense Refill   Calcium Carbonate-Vitamin D 600-400 MG-UNIT per chew tablet Chew 1 tablet by mouth 2 (two) times daily.     estradiol (VIVELLE-DOT) 0.05 MG/24HR patch estradiol 0.05 mg/24 hr semiweekly transdermal patch  APPLY ONE PATCH ON THE SKIN TWICE A WEEK     levothyroxine (SYNTHROID) 75 MCG tablet TAKE 1 TABLET BY MOUTH EVERY DAY BEFORE BREAKFAST 90 tablet 0   progesterone (PROMETRIUM) 100 MG capsule Take 100 mg by mouth at bedtime.     No current facility-administered medications on file prior to visit.     Review of Systems As in subjective    Objective:   Physical Exam Due to coronavirus pandemic stay at home measures, patient visit was virtual and they were not examined in person.   Temp (!) 97 F (36.1 C)   Wt 112  lb (50.8 kg)   BMI 18.64 kg/m   [id number] ill appearing Congested sounding somewhat hoarse     Assessment:     Encounter Diagnoses  Name Primary?   Acute cough Yes   Respiratory tract infection    Fever, unspecified fever cause    Eye discharge        Plan:     Discussed symptom and treatment recommendations.    Since mucinex DM is drying you out, change to 1/2 tablet BID, begin medicaiton below, rest, hydrate well.   Advise warm compresses for the eyes, can use Visine or other wetting drops for the eyes.  If not much improved over the next 3-5 days then recheck.   Nejla was seen today for dry  cough.  Diagnoses and all orders for this visit:  Acute cough  Respiratory tract infection  Fever, unspecified fever cause  Eye discharge  Other orders -     azithromycin (ZITHROMAX) 250 MG tablet; 2 tablets day 1, then 1 tablet days 2-4 -     promethazine-dextromethorphan (PROMETHAZINE-DM) 6.25-15 MG/5ML syrup; Take 5 mLs by mouth 4 (four) times daily as needed for cough.   F/u prn

## 2023-01-29 ENCOUNTER — Telehealth: Payer: Self-pay | Admitting: Medical

## 2023-01-29 NOTE — Telephone Encounter (Signed)
Pt called and wanted to let you know that she took a covid test yesterday and it was negative and she feels much better and she wanted to thank you very much

## 2023-02-04 ENCOUNTER — Ambulatory Visit (HOSPITAL_COMMUNITY)
Admission: RE | Admit: 2023-02-04 | Discharge: 2023-02-04 | Disposition: A | Discharge status: Home / Self Care | Payer: Medicare HMO | Source: Ambulatory Visit | Attending: Cardiology | Admitting: Cardiology

## 2023-02-04 DIAGNOSIS — I6523 Occlusion and stenosis of bilateral carotid arteries: Secondary | ICD-10-CM | POA: Diagnosis not present

## 2023-02-04 DIAGNOSIS — R0989 Other specified symptoms and signs involving the circulatory and respiratory systems: Secondary | ICD-10-CM | POA: Diagnosis not present

## 2023-02-12 ENCOUNTER — Ambulatory Visit (INDEPENDENT_AMBULATORY_CARE_PROVIDER_SITE_OTHER): Payer: Medicare HMO | Admitting: Medical

## 2023-02-12 ENCOUNTER — Encounter: Payer: Self-pay | Admitting: Medical

## 2023-02-12 VITALS — BP 150/80 | HR 74 | Ht 64.0 in | Wt 110.4 lb

## 2023-02-12 DIAGNOSIS — R030 Elevated blood-pressure reading, without diagnosis of hypertension: Secondary | ICD-10-CM

## 2023-02-12 DIAGNOSIS — E039 Hypothyroidism, unspecified: Secondary | ICD-10-CM

## 2023-02-12 DIAGNOSIS — Z8249 Family history of ischemic heart disease and other diseases of the circulatory system: Secondary | ICD-10-CM

## 2023-02-12 DIAGNOSIS — I6523 Occlusion and stenosis of bilateral carotid arteries: Secondary | ICD-10-CM | POA: Diagnosis not present

## 2023-02-12 DIAGNOSIS — M8580 Other specified disorders of bone density and structure, unspecified site: Secondary | ICD-10-CM

## 2023-02-12 DIAGNOSIS — Z7185 Encounter for immunization safety counseling: Secondary | ICD-10-CM | POA: Diagnosis not present

## 2023-02-12 DIAGNOSIS — Z8679 Personal history of other diseases of the circulatory system: Secondary | ICD-10-CM | POA: Diagnosis not present

## 2023-02-12 DIAGNOSIS — Z0000 Encounter for general adult medical examination without abnormal findings: Secondary | ICD-10-CM | POA: Diagnosis not present

## 2023-02-12 LAB — POCT URINALYSIS DIP (PROADVANTAGE DEVICE)
Bilirubin, UA: NEGATIVE
Blood, UA: NEGATIVE
Glucose, UA: NEGATIVE mg/dL
Nitrite, UA: NEGATIVE
Specific Gravity, Urine: 1.03
Urobilinogen, Ur: 0.2
pH, UA: 6 (ref 5.0–8.0)

## 2023-02-12 LAB — VITAMIN D 25 HYDROXY (VIT D DEFICIENCY, FRACTURES)

## 2023-02-12 LAB — TSH+FREE T4

## 2023-02-12 MED ORDER — PREDNISONE 20 MG PO TABS: ORAL_TABLET | ORAL | 0 refills | Status: DC

## 2023-02-12 MED ORDER — FENOFIBRATE 48 MG PO TABS: 48.0000 mg | ORAL_TABLET | Freq: Two times a day (BID) | ORAL | 2 refills | Status: DC

## 2023-02-12 MED ORDER — LEVOTHYROXINE SODIUM 75 MCG PO TABS: ORAL_TABLET | ORAL | 1 refills | Status: DC

## 2023-02-12 MED ORDER — HYDROCODONE BIT-HOMATROP MBR 5-1.5 MG/5ML PO SOLN: 5.0000 mL | Freq: Three times a day (TID) | ORAL | 0 refills | Status: AC | PRN

## 2023-02-12 NOTE — Assessment & Plan Note (Signed)
Declines vaccines today

## 2023-02-12 NOTE — Progress Notes (Signed)
Repatha????   Has cuff    Crestor, out of the question  Joint swelling and aches,   Declines vaccine   Subjective    HPI HPI     Annual Exam    Additional comments: Fasting (had some cereal at 7:15 and a sip of Pepsi at 9 am) annual exam. Had AWV 01/15/23. Could not give UA, will try again on way out. She states there was one test on her labs for insurance that was high.       Last edited by Melonie Florida, RMA on 02/12/2023  1:37 PM.       Dr. Marcelle Overlie, [facility] for [facility]ist Eye doctor Dr. Maureen Ralphs, orthopedics Dr. Nona Dell, cardiology Dr. Erick Blinks, gastroenterology   Allison Koch is a 67 y.o. female who presents today for a complete physical exam.     Most recent fall risk assessment:    02/12/2023    1:34 PM  Fall Risk   Falls in the past year? 0  Number falls in past yr: 0  Injury with Fall? 0  Risk for fall due to : No Fall Risks  Follow up Falls evaluation completed     Most recent depression screenings:    02/12/2023    1:34 PM 01/15/2023    3:04 PM  PHQ 2/9 Scores  PHQ - 2 Score 0 0  PHQ- 9 Score  0    Patient Active Problem List   Diagnosis Date Noted   Encounter for annual wellness visit (AWV) in Medicare patient 02/12/2023   Elevated blood pressure reading in office without diagnosis of hypertension 02/12/2023   Atherosclerosis of both carotid arteries 02/12/2023   JAK2 gene mutation 05/28/2022   Osteopenia 01/17/2022   Family history of premature CAD 01/17/2022   History of COVID-19 02/21/2021   Polyarthralgia 02/21/2021   Pain and swelling of right knee 02/21/2021   Leukocytosis 01/09/2021   Encounter for health maintenance examination in adult 06/30/2020   Vaccine counseling 06/30/2020   Tubular adenoma 06/30/2020   Hypothyroidism 08/23/2011      Outpatient Medications Prior to Visit  Medication Sig Note   Calcium Carbonate-Vitamin D 600-400 MG-UNIT per chew tablet Chew 1 tablet by mouth 2  (two) times daily.    Dextromethorphan HBr (DELSYM PO) Take 10 mLs by mouth in the morning and at bedtime. 02/12/2023: Last dose last night   estradiol (VIVELLE-DOT) 0.05 MG/24HR patch estradiol 0.05 mg/24 hr semiweekly transdermal patch  APPLY ONE PATCH ON THE SKIN TWICE A WEEK    progesterone (PROMETRIUM) 100 MG capsule Take 100 mg by mouth at bedtime.    [DISCONTINUED] levothyroxine (SYNTHROID) 75 MCG tablet TAKE 1 TABLET BY MOUTH EVERY DAY BEFORE BREAKFAST    promethazine-dextromethorphan (PROMETHAZINE-DM) 6.25-15 MG/5ML syrup Take 5 mLs by mouth 4 (four) times daily as needed for cough. (Patient not taking: Reported on 02/12/2023)    [DISCONTINUED] azithromycin (ZITHROMAX) 250 MG tablet 2 tablets day 1, then 1 tablet days 2-4    No facility-administered medications prior to visit.   Past Surgical History:  Procedure Laterality Date   COLONOSCOPY  07/2019   Dr. Vonna Kotyk Pyrtle, tubular adenoma, repeat 5 years   EYE SURGERY     TONSILLECTOMY     UTERINE FIBROID SURGERY     WISDOM TOOTH EXTRACTION     Review of Systems  Constitutional:  Negative for chills, fever, malaise/fatigue and weight loss.  HENT:  Positive for congestion and sinus pain. Negative for ear pain, hearing  loss, sore throat and tinnitus.   Eyes:  Negative for blurred vision, pain and redness.  Respiratory:  Negative for cough, hemoptysis and shortness of breath.   Cardiovascular:  Negative for chest pain, palpitations, orthopnea, claudication and leg swelling.  Gastrointestinal:  Negative for abdominal pain, blood in stool, constipation, diarrhea, nausea and vomiting.  Genitourinary:  Negative for dysuria, flank pain, frequency, hematuria and urgency.  Musculoskeletal:  Negative for falls, joint pain and myalgias.  Skin:  Negative for itching and rash.  Neurological:  Negative for dizziness, tingling, speech change, weakness and headaches.  Endo/Heme/Allergies:  Negative for polydipsia. Does not bruise/bleed easily.   Psychiatric/Behavioral:  Negative for depression and memory loss. The patient is not nervous/anxious and does not have insomnia.        Objective:     BP (!) 150/80   Pulse 74   Ht 5\' 4"  (1.626 m)   Wt 110 lb 6.4 oz (50.1 kg)   SpO2 98%   BMI 18.95 kg/m    Physical Exam Vitals and nursing note reviewed.  Constitutional:      General: She is not in acute distress.    Appearance: Normal appearance. She is not ill-appearing.  HENT:     Head: Normocephalic and atraumatic.     Right Ear: External ear normal.     Left Ear: External ear normal.     Nose: Congestion present.     Mouth/Throat:     Mouth: Mucous membranes are moist.     Pharynx: Oropharynx is clear.  Eyes:     Extraocular Movements: Extraocular movements intact.     Conjunctiva/sclera: Conjunctivae normal.     Pupils: Pupils are equal, round, and reactive to light.  Neck:     Vascular: No carotid bruit.  Cardiovascular:     Rate and Rhythm: Normal rate and regular rhythm.     Pulses: Normal pulses.     Heart sounds: Normal heart sounds. No murmur heard. Pulmonary:     Effort: Pulmonary effort is normal. No respiratory distress.     Breath sounds: Normal breath sounds. No wheezing or rales.  Abdominal:     General: Bowel sounds are normal. There is no distension.     Palpations: Abdomen is soft. There is no mass.     Tenderness: There is no abdominal tenderness.     Hernia: No hernia is present.  Genitourinary:    Comments: Deferred to gyn Musculoskeletal:        General: No swelling, tenderness or deformity. Normal range of motion.     Cervical back: Normal range of motion and neck supple. No tenderness.     Right lower leg: No edema.     Left lower leg: No edema.  Lymphadenopathy:     Cervical: No cervical adenopathy.  Skin:    General: Skin is warm and dry.     Capillary Refill: Capillary refill takes less than 2 seconds.     Findings: No rash.  Neurological:     Mental Status: She is alert and  oriented to person, place, and time. Mental status is at baseline.     Cranial Nerves: No cranial nerve deficit.     Sensory: No sensory deficit.     Motor: No weakness.     Gait: Gait normal.     Deep Tendon Reflexes: Reflexes normal.  Psychiatric:        Mood and Affect: Mood normal.        Behavior: Behavior normal.  Judgment: Judgment normal.         Assessment & Plan:    Routine Health Maintenance and Physical Exam  Immunization History  Administered Date(s) Administered   PFIZER(Purple Top)SARS-COV-2 Vaccination 03/24/2020, 04/15/2020    Health Maintenance  Topic Date Due   Zoster Vaccines- Shingrix (1 of 2) 05/15/2023 (Originally 11/29/2005)   INFLUENZA VACCINE  04/18/2023   Medicare Annual Wellness (AWV)  02/12/2024   MAMMOGRAM  04/09/2024   Colonoscopy  07/21/2024   DEXA SCAN  Completed   HPV VACCINES  Aged Out   DTaP/Tdap/Td  Discontinued   Pneumonia Vaccine 89+ Years old  Discontinued   COVID-19 Vaccine  Discontinued   Hepatitis C Screening  Discontinued   Declines vaccines today   Discussed health benefits of physical activity, and encouraged her to engage in regular exercise appropriate for her age and condition.  I reviewed labs she had on her phone from 11/2022 life insurance physical  12/06/22 Total chol 202, HDL 54.5 LDL 121 VLDL 25 TRIG 128 Chol/HDL ratio 3.7 Fructosamine normal Glucose 60 Hgba 5.3% Creatinine 0.89 GFR 87.81 Hepatic otherwise norma UA  normal CEA 1.7 normal  Ongoing head congestion and some cough Begin hycodan syrup, prednisone short dose for lingering symptoms.  See recent virtual consult from 01/2023.  Problem List Items Addressed This Visit     Hypothyroidism    Continue current therapy.  Routine labs today      Relevant Medications   levothyroxine (SYNTHROID) 75 MCG tablet   Other Relevant Orders   TSH + free T4   Vaccine counseling    Declines vaccines today      Osteopenia   Relevant Orders    VITAMIN D 25 Hydroxy (Vit-D Deficiency, Fractures)   Family history of premature CAD   Encounter for annual wellness visit (AWV) in Medicare patient - Primary   Relevant Orders   CBC with Differential/Platelet   TSH + free T4   VITAMIN D 25 Hydroxy (Vit-D Deficiency, Fractures)   POCT Urinalysis DIP (Proadvantage Device) (Completed)   Elevated blood pressure reading in office without diagnosis of hypertension    Collect home BP readings and follow up in 2-3 weeks      Atherosclerosis of both carotid arteries    Refuses statin.  Begin trial of fenofibrate BID as she doesn't tolerate large tablets.      Relevant Medications   fenofibrate (TRICOR) 48 MG tablet   RESOLVED: History of atrial fibrillation    Prior, resolved      Return for CPX physical fasting.     Kristian Covey, PA-C

## 2023-02-12 NOTE — Assessment & Plan Note (Signed)
Refuses statin.  Begin trial of fenofibrate BID as she doesn't tolerate large tablets.

## 2023-02-12 NOTE — Assessment & Plan Note (Signed)
Continue current therapy.  Routine labs today

## 2023-02-12 NOTE — Assessment & Plan Note (Signed)
Collect home BP readings and follow up in 2-3 weeks

## 2023-02-12 NOTE — Assessment & Plan Note (Signed)
Prior, resolved

## 2023-02-13 ENCOUNTER — Other Ambulatory Visit: Payer: Self-pay | Admitting: Medical

## 2023-02-13 LAB — CBC WITH DIFFERENTIAL/PLATELET
Basophils Absolute: 0.1 10*3/uL (ref 0.0–0.2)
Basos: 1 %
EOS (ABSOLUTE): 0.1 10*3/uL (ref 0.0–0.4)
Eos: 1 %
Hematocrit: 42.8 % (ref 34.0–46.6)
Hemoglobin: 14.1 g/dL (ref 11.1–15.9)
Immature Grans (Abs): 0.2 10*3/uL — ABNORMAL HIGH (ref 0.0–0.1)
Immature Granulocytes: 2 %
Lymphocytes Absolute: 2 10*3/uL (ref 0.7–3.1)
Lymphs: 16 %
MCH: 29.4 pg (ref 26.6–33.0)
MCHC: 32.9 g/dL (ref 31.5–35.7)
MCV: 89 fL (ref 79–97)
Monocytes Absolute: 0.8 10*3/uL (ref 0.1–0.9)
Monocytes: 7 %
Neutrophils Absolute: 9.1 10*3/uL — ABNORMAL HIGH (ref 1.4–7.0)
Neutrophils: 73 %
Platelets: 425 10*3/uL (ref 150–450)
RBC: 4.8 x10E6/uL (ref 3.77–5.28)
RDW: 12.8 % (ref 11.7–15.4)
WBC: 12.2 10*3/uL — ABNORMAL HIGH (ref 3.4–10.8)

## 2023-02-13 LAB — TSH+FREE T4: Free T4: 1.74 ng/dL (ref 0.82–1.77)

## 2023-02-13 MED ORDER — DOXYCYCLINE MONOHYDRATE 100 MG PO TABS: 100.0000 mg | ORAL_TABLET | Freq: Two times a day (BID) | ORAL | 0 refills | Status: DC

## 2023-02-13 NOTE — Progress Notes (Signed)
Results sent through MyChart

## 2023-02-15 DIAGNOSIS — H5203 Hypermetropia, bilateral: Secondary | ICD-10-CM | POA: Diagnosis not present

## 2023-02-15 DIAGNOSIS — H52223 Regular astigmatism, bilateral: Secondary | ICD-10-CM | POA: Diagnosis not present

## 2023-02-15 DIAGNOSIS — H524 Presbyopia: Secondary | ICD-10-CM | POA: Diagnosis not present

## 2023-03-29 ENCOUNTER — Other Ambulatory Visit: Payer: Medicare HMO

## 2023-04-05 ENCOUNTER — Ambulatory Visit: Payer: Medicare HMO | Admitting: Physician Assistant

## 2023-04-08 ENCOUNTER — Inpatient Hospital Stay: Payer: Medicare HMO | Attending: Hematology

## 2023-04-08 DIAGNOSIS — D75839 Thrombocytosis, unspecified: Secondary | ICD-10-CM | POA: Diagnosis not present

## 2023-04-08 DIAGNOSIS — Z79899 Other long term (current) drug therapy: Secondary | ICD-10-CM | POA: Diagnosis not present

## 2023-04-08 DIAGNOSIS — D72828 Other elevated white blood cell count: Secondary | ICD-10-CM | POA: Diagnosis not present

## 2023-04-08 DIAGNOSIS — Z1589 Genetic susceptibility to other disease: Secondary | ICD-10-CM

## 2023-04-08 DIAGNOSIS — E559 Vitamin D deficiency, unspecified: Secondary | ICD-10-CM

## 2023-04-08 DIAGNOSIS — D72829 Elevated white blood cell count, unspecified: Secondary | ICD-10-CM

## 2023-04-08 LAB — CBC WITH DIFFERENTIAL/PLATELET
Abs Immature Granulocytes: 0.07 10*3/uL (ref 0.00–0.07)
Basophils Absolute: 0.1 10*3/uL (ref 0.0–0.1)
Basophils Relative: 1 %
Eosinophils Absolute: 0.1 10*3/uL (ref 0.0–0.5)
Eosinophils Relative: 1 %
HCT: 43 % (ref 36.0–46.0)
Hemoglobin: 13.9 g/dL (ref 12.0–15.0)
Immature Granulocytes: 1 %
Lymphocytes Relative: 16 %
Lymphs Abs: 2 10*3/uL (ref 0.7–4.0)
MCH: 29.8 pg (ref 26.0–34.0)
MCHC: 32.3 g/dL (ref 30.0–36.0)
MCV: 92.3 fL (ref 80.0–100.0)
Monocytes Absolute: 1 10*3/uL (ref 0.1–1.0)
Monocytes Relative: 8 %
Neutro Abs: 9.3 10*3/uL — ABNORMAL HIGH (ref 1.7–7.7)
Neutrophils Relative %: 73 %
Platelets: 406 10*3/uL — ABNORMAL HIGH (ref 150–400)
RBC: 4.66 MIL/uL (ref 3.87–5.11)
RDW: 13.6 % (ref 11.5–15.5)
WBC: 12.5 10*3/uL — ABNORMAL HIGH (ref 4.0–10.5)
nRBC: 0 % (ref 0.0–0.2)

## 2023-04-08 LAB — COMPREHENSIVE METABOLIC PANEL
ALT: 52 U/L — ABNORMAL HIGH (ref 0–44)
AST: 71 U/L — ABNORMAL HIGH (ref 15–41)
Albumin: 3.9 g/dL (ref 3.5–5.0)
Alkaline Phosphatase: 42 U/L (ref 38–126)
Anion gap: 8 (ref 5–15)
BUN: 15 mg/dL (ref 8–23)
CO2: 28 mmol/L (ref 22–32)
Calcium: 10.2 mg/dL (ref 8.9–10.3)
Chloride: 98 mmol/L (ref 98–111)
Creatinine, Ser: 1.02 mg/dL — ABNORMAL HIGH (ref 0.44–1.00)
GFR, Estimated: >60 mL/min (ref >60)
Glucose, Bld: 117 mg/dL — ABNORMAL HIGH (ref 70–99)
Potassium: 3.6 mmol/L (ref 3.5–5.1)
Sodium: 134 mmol/L — ABNORMAL LOW (ref 135–145)
Total Bilirubin: 0.9 mg/dL (ref 0.3–1.2)
Total Protein: 6.9 g/dL (ref 6.5–8.1)

## 2023-04-08 LAB — URIC ACID: Uric Acid, Serum: 3.4 mg/dL (ref 2.5–7.1)

## 2023-04-08 LAB — LACTATE DEHYDROGENASE: LDH: 138 U/L (ref 98–192)

## 2023-04-11 DIAGNOSIS — Z01419 Encounter for gynecological examination (general) (routine) without abnormal findings: Secondary | ICD-10-CM | POA: Diagnosis not present

## 2023-04-11 DIAGNOSIS — Z1231 Encounter for screening mammogram for malignant neoplasm of breast: Secondary | ICD-10-CM | POA: Diagnosis not present

## 2023-04-11 DIAGNOSIS — Z681 Body mass index (BMI) 19 or less, adult: Secondary | ICD-10-CM | POA: Diagnosis not present

## 2023-04-11 LAB — HM MAMMOGRAPHY

## 2023-04-12 NOTE — Progress Notes (Unsigned)
[facility] [address], [place] [address]   CLINIC:  Medical Oncology/Hematology  PCP:  Genia Del [address] [place] [address] [phone]   REASON FOR VISIT:  Follow-up for JAK2+ leukocytosis and hypercalcemia   PRIOR THERAPY: None   CURRENT THERAPY: Surveillance  INTERVAL HISTORY:   Ms. Downing 67 y.o. female returns for routine follow-up of JAK2+ leukocytosis and mild hypercalcemia.  She was last seen by Rojelio Brenner PA-C on 10/05/2022.  Overall, she is feeling fairly well, although she is dealing with ongoing grief following her mother passing away in December 2023.  She denies any changes in her symptoms or baseline health status since her last visit.  She denies any recent infections or steroids.  She denies any fever, night sweats, weight loss, or chills.   She has not noticed any new lumps or bumps.  She is taking Caltrate supplement once daily.    She has 90% energy and 100% appetite. She endorses that she is maintaining a stable weight.   ASSESSMENT & PLAN:  1.  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 - She does not have any B symptoms/infections. - She denies any history of splenectomy.  No systemic steroids.  No history of connective tissue disorders. 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 Normal LDH, normal reticulocytes.  Rheumatoid factor and ANA negative.  Normal ESR and CRP. - Most recent CBC (04/08/2023): WBC 12.5, ANC 9.3, platelets 406.  Hgb 13.9/hematocrit 43.0%.  Normal LDH.  CMP shows mild transaminitis (AST 71, ALT 52), which is new (reports that she started fenofibrate in May 2024).  Normal uric acid. - No palpable splenomegaly or lymphadenopathy on exam. - PLAN: Patient has mild thrombocytosis, which is new.  Mild leukocytosis  has been intermittent for the past 10+ years. - No indication for cytoreductive therapy at this time.  We will continue close monitoring and consider starting Hydrea if HCT >45%, platelets >400, or WBC >20.0. - Patient instructed to discuss with PCP regarding discontinuation of fenofibrate.  Will recheck CMP in 1 month and consider GI referral if persistent transaminitis. - CBC/D only monthly x 3 months due to deviation from baseline blood counts. - If no worsening abnormalities, we will plan on repeat labs (CBC/differential, LDH, uric acid) and RTC in 6 months. - We have discussed that at present she has JAK2 mutation, but no definitive MPN at this time.  We have discussed the risk of future MPN, vascular events, and hematologic malignancy.   2.  Mild hypercalcemia - Mild hypercalcemia intermittently since 2012 - Previously taking Caltrate twice daily. - Has reduced Caltrate once daily since July 2023.   - SPEP unremarkable.  Vitamin D 25 and vitamin D 1, 25 both within normal limits.  Mildly low PTH at 10. - Most recent labs (04/08/2023): Normal calcium 10.2 - PLAN: Suspect prior hypercalcemia secondary to excessive calcium supplementation.  Patient instructed to continue Caltrate only ONCE daily.     3.  Other history - Lives at home with husband.  Retired from office type work.  No chemical exposure.  Non-smoker. - Mother had colon cancer.  Paternal aunt had cancer in her foot.  Maternal aunt had brain cancer.`  PLAN SUMMARY: >> Monthly CBC/D x 3 (will also recheck CMP in 1 month) >> Labs in 6 months = CBC/D, LDH, uric acid, CMP  >>  OFFICE visit in 6 months (after labs)    REVIEW OF SYSTEMS:   Review of Systems  Constitutional:  Negative for appetite change, chills, diaphoresis, fatigue, fever and unexpected weight change.  HENT:   Negative for lump/mass and nosebleeds.   Eyes:  Negative for eye problems.  Respiratory:  Negative for cough, hemoptysis and shortness of breath.    Cardiovascular:  Negative for chest pain, leg swelling and palpitations.  Gastrointestinal:  Negative for abdominal pain, blood in stool, constipation, diarrhea, nausea and vomiting.  Genitourinary:  Negative for hematuria.   Skin: Negative.   Neurological:  Negative for dizziness, headaches and light-headedness.  Hematological:  Does not bruise/bleed easily.     PHYSICAL EXAM:  ECOG PERFORMANCE STATUS: 0 - Asymptomatic  There were no vitals filed for this visit.  There were no vitals filed for this visit.  Physical Exam Constitutional:      Appearance: Normal appearance.  HENT:     Head: Normocephalic and atraumatic.     Mouth/Throat:     Mouth: Mucous membranes are moist.  Eyes:     Extraocular Movements: Extraocular movements intact.     Pupils: Pupils are equal, round, and reactive to light.  Cardiovascular:     Rate and Rhythm: Normal rate and regular rhythm.     Pulses: Normal pulses.     Heart sounds: Normal heart sounds.  Pulmonary:     Effort: Pulmonary effort is normal.     Breath sounds: Normal breath sounds.  Abdominal:     General: Bowel sounds are normal.     Palpations: Abdomen is soft. There is no splenomegaly.     Tenderness: There is no abdominal tenderness.  Musculoskeletal:        General: No swelling.     Right lower leg: No edema.     Left lower leg: No edema.  Lymphadenopathy:     Head:     Right side of head: No submental, submandibular, tonsillar, preauricular, posterior auricular or occipital adenopathy.     Left side of head: No submental, submandibular, tonsillar, preauricular, posterior auricular or occipital adenopathy.     Cervical: No cervical adenopathy.     Right cervical: No superficial, deep or posterior cervical adenopathy.    Left cervical: No superficial, deep or posterior cervical adenopathy.     Upper Body:     Right upper body: No supraclavicular adenopathy.     Left upper body: No supraclavicular adenopathy.  Skin:     General: Skin is warm and dry.  Neurological:     General: No focal deficit present.     Mental Status: She is alert and oriented to person, place, and time.  Psychiatric:        Mood and Affect: Mood normal.        Behavior: Behavior normal.     PAST MEDICAL/SURGICAL HISTORY:  Past Medical History:  Diagnosis Date   Hypothyroidism    PAF (paroxysmal atrial fibrillation) (HCC)    Noted in setting of acute illness   Tubular adenoma 07/2019   colonoscopy, Dr. Erick Blinks   Past Surgical History:  Procedure Laterality Date   COLONOSCOPY  07/2019   Dr. Erick Blinks, tubular adenoma, repeat 5 years   EYE SURGERY     TONSILLECTOMY     UTERINE FIBROID SURGERY     WISDOM TOOTH EXTRACTION      SOCIAL HISTORY:  Social History   Socioeconomic History   Marital status: Married    Spouse  name: Not on file   Number of children: Not on file   Years of education: Not on file   Highest education level: Not on file  Occupational History   Not on file  Tobacco Use   Smoking status: Never   Smokeless tobacco: Never  Vaping Use   Vaping status: Never Used  Substance and Sexual Activity   Alcohol use: No   Drug use: No   Sexual activity: Not on file  Other Topics Concern   Not on file  Social History Narrative   Advertising account executive 4H camp, [place]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   Social Determinants of Health   Financial Resource Strain: Low Risk  (02/12/2023)   Overall Financial Resource Strain (CARDIA)    Difficulty of Paying Living Expenses: Not hard at all  Food Insecurity: No Food Insecurity (02/12/2023)   Hunger Vital Sign    Worried About Running Out of Food in the Last Year: Never true    Ran Out of Food in the Last Year: Never true  Transportation Needs: No Transportation Needs (02/12/2023)   PRAPARE - Administrator, Civil Service (Medical): No    Lack of Transportation (Non-Medical): No  Physical Activity: Sufficiently Active (01/15/2023)   Exercise Vital Sign    Days of  Exercise per Week: 4 days    Minutes of Exercise per Session: 60 min  Stress: No Stress Concern Present (02/12/2023)   [employer] of Occupational Health - Occupational Stress Questionnaire    Feeling of Stress : Not at all  Social Connections: Socially Integrated (02/12/2023)   Social Connection and Isolation Panel [NHANES]    Frequency of Communication with Friends and Family: More than three times a week    Frequency of Social Gatherings with Friends and Family: More than three times a week    Attends Religious Services: More than 4 times per year    Active Member of [employer] or Organizations: Yes    Attends Banker Meetings: Not on file    Marital Status: Married  Intimate Partner Violence: Not At Risk (02/12/2023)   Humiliation, Afraid, Rape, and Kick questionnaire    Fear of Current or Ex-Partner: No    Emotionally Abused: No    Physically Abused: No    Sexually Abused: No    FAMILY HISTORY:  Family History  Problem Relation Age of Onset   Arrhythmia Mother        atrial fib   Colon cancer Mother 59   Dementia Mother    COPD Mother    Cancer Mother    Heart attack Father        CABG x9, maybe in 67s   Heart failure Father        died of CHF   Diabetes Father    Esophageal cancer Neg Hx    Rectal cancer Neg Hx    Stomach cancer Neg Hx    Heart disease Neg Hx     CURRENT MEDICATIONS:  Outpatient Encounter Medications as of 04/15/2023  Medication Sig Note   Calcium Carbonate-Vitamin D 600-400 MG-UNIT per chew tablet Chew 1 tablet by mouth 2 (two) times daily.    Dextromethorphan HBr (DELSYM PO) Take 10 mLs by mouth in the morning and at bedtime. 02/12/2023: Last dose last night   doxycycline (ADOXA) 100 MG tablet Take 1 tablet (100 mg total) by mouth 2 (two) times daily.    estradiol (VIVELLE-DOT) 0.05 MG/24HR patch estradiol 0.05 mg/24 hr semiweekly transdermal patch  APPLY  ONE PATCH ON THE SKIN TWICE A WEEK 01/17/2022: Changed mg 9.05 mg patch    fenofibrate (TRICOR) 48 MG tablet Take 1 tablet (48 mg total) by mouth in the morning and at bedtime.    levothyroxine (SYNTHROID) 75 MCG tablet TAKE 1 TABLET BY MOUTH EVERY DAY BEFORE BREAKFAST    predniSONE (DELTASONE) 20 MG tablet 3 tablets today, 2 tablets tomorrow, 1 tablet the third day    progesterone (PROMETRIUM) 100 MG capsule Take 100 mg by mouth at bedtime.    promethazine-dextromethorphan (PROMETHAZINE-DM) 6.25-15 MG/5ML syrup Take 5 mLs by mouth 4 (four) times daily as needed for cough. (Patient not taking: Reported on 02/12/2023)    No facility-administered encounter medications on file as of 04/15/2023.    ALLERGIES:  Allergies  Allergen Reactions   Other     Mycins per patient.   Penicillins    Statins     Myalgias and arthralgias   Sulfa Antibiotics Other (See Comments)    LABORATORY DATA:  I have reviewed the labs as listed.  CBC    Component Value Date/Time   WBC 12.5 (H) 04/08/2023 1325   RBC 4.66 04/08/2023 1325   HGB 13.9 04/08/2023 1325   HGB 14.1 02/12/2023 1427   HCT 43.0 04/08/2023 1325   HCT 42.8 02/12/2023 1427   PLT 406 (H) 04/08/2023 1325   PLT 425 02/12/2023 1427   MCV 92.3 04/08/2023 1325   MCV 89 02/12/2023 1427   MCH 29.8 04/08/2023 1325   MCHC 32.3 04/08/2023 1325   RDW 13.6 04/08/2023 1325   RDW 12.8 02/12/2023 1427   LYMPHSABS 2.0 04/08/2023 1325   LYMPHSABS 2.0 02/12/2023 1427   MONOABS 1.0 04/08/2023 1325   EOSABS 0.1 04/08/2023 1325   EOSABS 0.1 02/12/2023 1427   BASOSABS 0.1 04/08/2023 1325   BASOSABS 0.1 02/12/2023 1427      Latest Ref Rng & Units 04/08/2023    1:25 PM 09/28/2022   10:55 AM 02/23/2022   11:47 AM  CMP  Glucose 70 - 99 mg/dL 952  841    BUN 8 - 23 mg/dL 15  9    Creatinine 3.24 - 1.00 mg/dL 4.01  0.27    Sodium 253 - 145 mmol/L 134  139    Potassium 3.5 - 5.1 mmol/L 3.6  4.0    Chloride 98 - 111 mmol/L 98  104    CO2 22 - 32 mmol/L 28  26    Calcium 8.9 - 10.3 mg/dL 66.4  9.3  40.3   Total Protein 6.5 -  8.1 g/dL 6.9  6.5    Total Bilirubin 0.3 - 1.2 mg/dL 0.9  0.4    Alkaline Phos 38 - 126 U/L 42  52    AST 15 - 41 U/L 71  23    ALT 0 - 44 U/L 52  9      DIAGNOSTIC IMAGING:  I have independently reviewed the relevant imaging and discussed with the patient.   WRAP UP:  All questions were answered. The patient knows to call the clinic with any problems, questions or concerns.  Medical decision making: Moderate  Time spent on visit: I spent 20 minutes counseling the patient face to face. The total time spent in the appointment was 30 minutes and more than 50% was on counseling.  Carnella Guadalajara, PA-C  04/15/23 1:46 PM

## 2023-04-15 ENCOUNTER — Ambulatory Visit: Payer: Medicare HMO | Admitting: Physician Assistant

## 2023-04-15 ENCOUNTER — Inpatient Hospital Stay: Payer: Medicare HMO | Admitting: Physician Assistant

## 2023-04-15 VITALS — BP 133/52 | HR 65 | Temp 99.1°F | Resp 17 | Ht 64.0 in | Wt 114.8 lb

## 2023-04-15 DIAGNOSIS — Z1589 Genetic susceptibility to other disease: Secondary | ICD-10-CM

## 2023-04-15 DIAGNOSIS — D75839 Thrombocytosis, unspecified: Secondary | ICD-10-CM | POA: Diagnosis not present

## 2023-04-15 DIAGNOSIS — D72828 Other elevated white blood cell count: Secondary | ICD-10-CM | POA: Diagnosis not present

## 2023-04-15 DIAGNOSIS — Z79899 Other long term (current) drug therapy: Secondary | ICD-10-CM | POA: Diagnosis not present

## 2023-04-15 DIAGNOSIS — D72829 Elevated white blood cell count, unspecified: Secondary | ICD-10-CM | POA: Diagnosis not present

## 2023-04-15 NOTE — Patient Instructions (Signed)
[facility] at [facility] **VISIT SUMMARY & IMPORTANT INSTRUCTIONS **   You were seen today by Rojelio Brenner PA-C for your elevated white blood cells.    Elevated white blood cells Your white blood cells and platelets are both mildly elevated - this may be related to your JAK2 mutation. We will check your blood more closely over the next 3 months.  Elevated calcium Your calcium level has come back to normal. You can continue to take your Caltrate supplement, but should only take it ONCE daily.  FOLLOW-UP APPOINTMENT: Office visit in 6 months  ** Thank you for trusting me with your healthcare!  I strive to provide all of my patients with quality care at each visit.  If you receive a survey for this visit, I would be so grateful to you for taking the time to provide feedback.  Thank you in advance!  ~ Luca Burston                   Dr. Doreatha Massed   &   Rojelio Brenner, PA-C   - - - - - - - - - - - - - - - - - -    Thank you for choosing  [facility] Center at [facility] to provide your oncology and hematology care.  To afford each patient quality time with our provider, please arrive at least 15 minutes before your scheduled appointment time.   If you have a lab appointment with the Cancer Center please come in thru the Main Entrance and check in at the main information desk.  You need to re-schedule your appointment should you arrive 10 or more minutes late.  We strive to give you quality time with our providers, and arriving late affects you and other patients whose appointments are after yours.  Also, if you no show three or more times for appointments you may be dismissed from the clinic at the providers discretion.     Again, thank you for choosing [facility].  Our hope is that these requests will decrease the amount of time that you wait before being seen by our physicians.        _____________________________________________________________  Should you have questions after your visit to [facility], please contact our office at [phone] and follow the prompts.  Our office hours are 8:00 a.m. and 4:30 p.m. Monday - Friday.  Please note that voicemails left after 4:00 p.m. may not be returned until the following business day.  We are closed weekends and major holidays.  You do have access to a nurse 24-7, just call the main number to the clinic [phone] and do not press any options, hold on the line and a nurse will answer the phone.    For prescription refill requests, have your pharmacy contact our office and allow 72 hours.

## 2023-04-16 ENCOUNTER — Telehealth: Payer: Self-pay

## 2023-04-16 NOTE — Telephone Encounter (Signed)
Patient called regarding elevated liver results since taking fenofibrate. Saw hematologist yesterday and was told to notify [facility]. -AA

## 2023-04-17 ENCOUNTER — Telehealth: Payer: Self-pay | Admitting: *Deleted

## 2023-04-17 NOTE — Telephone Encounter (Signed)
Patient called to advise that she spoke with PCP regarding fenofibrate and elevated LFT.  She was advised to hold x 6 weeks and repeat levels.  Rojelio Brenner, PAC made aware.

## 2023-05-10 ENCOUNTER — Other Ambulatory Visit: Payer: Self-pay | Admitting: Medical

## 2023-05-16 ENCOUNTER — Inpatient Hospital Stay: Payer: Medicare HMO | Attending: Hematology

## 2023-05-16 DIAGNOSIS — D72829 Elevated white blood cell count, unspecified: Secondary | ICD-10-CM

## 2023-05-16 DIAGNOSIS — D72828 Other elevated white blood cell count: Secondary | ICD-10-CM | POA: Insufficient documentation

## 2023-05-16 DIAGNOSIS — Z1589 Genetic susceptibility to other disease: Secondary | ICD-10-CM

## 2023-05-16 LAB — COMPREHENSIVE METABOLIC PANEL
ALT: 16 U/L (ref 0–44)
AST: 26 U/L (ref 15–41)
Albumin: 3.6 g/dL (ref 3.5–5.0)
Alkaline Phosphatase: 47 U/L (ref 38–126)
Anion gap: 6 (ref 5–15)
BUN: 10 mg/dL (ref 8–23)
CO2: 26 mmol/L (ref 22–32)
Calcium: 9.2 mg/dL (ref 8.9–10.3)
Chloride: 104 mmol/L (ref 98–111)
Creatinine, Ser: 0.8 mg/dL (ref 0.44–1.00)
GFR, Estimated: >60 mL/min (ref >60)
Glucose, Bld: 92 mg/dL (ref 70–99)
Potassium: 3.4 mmol/L — ABNORMAL LOW (ref 3.5–5.1)
Sodium: 136 mmol/L (ref 135–145)
Total Bilirubin: 1 mg/dL (ref 0.3–1.2)
Total Protein: 6.3 g/dL — ABNORMAL LOW (ref 6.5–8.1)

## 2023-05-16 LAB — CBC WITH DIFFERENTIAL/PLATELET
Abs Immature Granulocytes: 0.04 10*3/uL (ref 0.00–0.07)
Basophils Absolute: 0.1 10*3/uL (ref 0.0–0.1)
Basophils Relative: 1 %
Eosinophils Absolute: 0.1 10*3/uL (ref 0.0–0.5)
Eosinophils Relative: 1 %
HCT: 41.9 % (ref 36.0–46.0)
Hemoglobin: 13.6 g/dL (ref 12.0–15.0)
Immature Granulocytes: 0 %
Lymphocytes Relative: 22 %
Lymphs Abs: 2.2 10*3/uL (ref 0.7–4.0)
MCH: 29.8 pg (ref 26.0–34.0)
MCHC: 32.5 g/dL (ref 30.0–36.0)
MCV: 91.9 fL (ref 80.0–100.0)
Monocytes Absolute: 0.8 10*3/uL (ref 0.1–1.0)
Monocytes Relative: 8 %
Neutro Abs: 7 10*3/uL (ref 1.7–7.7)
Neutrophils Relative %: 68 %
Platelets: 338 10*3/uL (ref 150–400)
RBC: 4.56 MIL/uL (ref 3.87–5.11)
RDW: 13.2 % (ref 11.5–15.5)
WBC: 10.2 10*3/uL (ref 4.0–10.5)
nRBC: 0 % (ref 0.0–0.2)

## 2023-05-22 ENCOUNTER — Encounter: Payer: Self-pay | Admitting: Physician Assistant

## 2023-06-17 ENCOUNTER — Inpatient Hospital Stay: Payer: Medicare HMO

## 2023-06-20 ENCOUNTER — Ambulatory Visit: Payer: Medicare HMO | Admitting: Medical

## 2023-06-20 ENCOUNTER — Ambulatory Visit (HOSPITAL_COMMUNITY)
Admission: RE | Admit: 2023-06-20 | Discharge: 2023-06-20 | Disposition: A | Discharge status: Home / Self Care | Payer: Medicare HMO | Source: Ambulatory Visit | Attending: Medical | Admitting: Medical

## 2023-06-20 ENCOUNTER — Other Ambulatory Visit: Payer: Self-pay | Admitting: Medical

## 2023-06-20 ENCOUNTER — Encounter: Payer: Self-pay | Admitting: Medical

## 2023-06-20 VITALS — BP 128/78 | HR 84 | Ht 64.0 in | Wt 116.8 lb

## 2023-06-20 DIAGNOSIS — M79604 Pain in right leg: Secondary | ICD-10-CM | POA: Insufficient documentation

## 2023-06-20 DIAGNOSIS — I700 Atherosclerosis of aorta: Secondary | ICD-10-CM

## 2023-06-20 DIAGNOSIS — Z9229 Personal history of other drug therapy: Secondary | ICD-10-CM | POA: Diagnosis not present

## 2023-06-20 DIAGNOSIS — M79661 Pain in right lower leg: Secondary | ICD-10-CM | POA: Insufficient documentation

## 2023-06-20 DIAGNOSIS — R030 Elevated blood-pressure reading, without diagnosis of hypertension: Secondary | ICD-10-CM | POA: Diagnosis not present

## 2023-06-20 DIAGNOSIS — I779 Disorder of arteries and arterioles, unspecified: Secondary | ICD-10-CM | POA: Diagnosis not present

## 2023-06-20 MED ORDER — PITAVASTATIN CALCIUM 2 MG PO TABS
2.0000 mg | ORAL_TABLET | ORAL | 0 refills | Status: DC
Start: 2023-06-20 — End: 2023-07-29

## 2023-06-20 NOTE — Progress Notes (Addendum)
Subjective:  Allison Koch is a 67 y.o. female who presents for Chief Complaint  Patient presents with   calf pain    Calf pain (right) x 10 days.      Here for couple concerns.  For a week and a half has tight place that is tender in right calf.  No warmth, no discoloration.  She was looking online on the Internet about this and was worried it could be a blood clot so she went to come in for evaluation.  Going to the beach this weekend.  No recent travel, no recent injury or surgery.  No recent or new strenuous activity outside of her norm. no fever, no shortness of breath.  She had tried prior Crestor and prior fenofibrate.  Not currently on lipid-lowering medication.  She has prior findings of aortic atherosclerosis and carotid artery atherosclerosis.  She had liver test that started to go up on fenofibrate and Crestor gave her myalgias and joint aches.  No other aggravating or relieving factors.    No other c/o.  Past Medical History:  Diagnosis Date   Hypothyroidism    PAF (paroxysmal atrial fibrillation) (HCC)    Noted in setting of acute illness   Tubular adenoma 07/2019   colonoscopy, Dr. Erick Blinks    Current Outpatient Medications on File Prior to Visit  Medication Sig Dispense Refill   Calcium Carbonate-Vitamin D 600-400 MG-UNIT per chew tablet Chew 1 tablet by mouth 2 (two) times daily.     estradiol (VIVELLE-DOT) 0.05 MG/24HR patch estradiol 0.05 mg/24 hr semiweekly transdermal patch  APPLY ONE PATCH ON THE SKIN TWICE A WEEK     ibuprofen (ADVIL) 200 MG tablet Take 200 mg by mouth every 6 (six) hours as needed.     levothyroxine (SYNTHROID) 75 MCG tablet TAKE 1 TABLET BY MOUTH EVERY DAY BEFORE BREAKFAST 90 tablet 1   progesterone (PROMETRIUM) 100 MG capsule Take 100 mg by mouth at bedtime.     No current facility-administered medications on file prior to visit.    The following portions of the patient's history were reviewed and updated as appropriate: allergies,  current medications, past family history, past medical history, past social history, past surgical history and problem list.  ROS Otherwise as in subjective above    Objective: BP 128/78   Pulse 84   Ht 5\' 4"  (1.626 m)   Wt 116 lb 12.8 oz (53 kg)   BMI 20.05 kg/m   BP Readings from Last 3 Encounters:  06/20/23 128/78  04/15/23 (!) 133/52  02/12/23 (!) 150/80    General appearance: alert, no distress, well developed, well nourished Right medial posterior calf proximal third of the lower leg with a small 1cm diameter+ area that somewhat tender but no warmth, no discoloration, no swelling, no obvious asymmetry compared to the left lower leg.  No deformity of the leg and normal leg muscle movements.  No sign of muscle rupture. Legs neurovascularly intact Negative Homans    Assessment: Encounter Diagnoses  Name Primary?   Right leg pain Yes   Right calf pain    History of postmenopausal HRT    Aortic atherosclerosis (HCC)    Bilateral carotid artery disease, unspecified type (HCC)    Elevated blood pressure reading without diagnosis of hypertension      Plan: Right leg pain and calf pain-we discussed possible causes which could include muscle spasm, muscle strain, phlebitis, superficial clot or even DVT.  No obvious findings of DVT but cannot  rule that out.  We will send for ultrasound.  If ultrasound negative for DVT we discussed using aspirin daily for 5-7 days, heat, massage, stretching and relative rest.  Aortic atherosclerosis, coronary artery atherosclerosis-she prior did not tolerate Crestor and fenofibrate.  We will begin at trial of Livalo every other day at low-dose along with healthy diet and exercise.  Elevated blood pressures in the past.  She has some recent readings mostly which are normal and a few elevated.  Readings include 116/54, 134/60, 116/66, 135/56, 162/67, 152/74.  Continue to monitor for now, BP normal today  Amil was seen today for calf  pain.  Diagnoses and all orders for this visit:  Right leg pain -     US Venous Img Lower Unilateral Right (DVT); Future -     Cancel: VAS [place] LOWER EXTREMITY VENOUS (DVT); Future  Right calf pain -     US Venous Img Lower Unilateral Right (DVT); Future -     Cancel: VAS [place] LOWER EXTREMITY VENOUS (DVT); Future  History of postmenopausal HRT -     US Venous Img Lower Unilateral Right (DVT); Future -     Cancel: VAS [place] LOWER EXTREMITY VENOUS (DVT); Future  Aortic atherosclerosis (HCC)  Bilateral carotid artery disease, unspecified type (HCC)  Elevated blood pressure reading without diagnosis of hypertension  Other orders -     Pitavastatin Calcium 2 MG TABS; Take 1 tablet (2 mg total) by mouth every other day.    Follow up: pending [place]

## 2023-06-20 NOTE — Progress Notes (Signed)
I called and spoke to patient about results.

## 2023-06-21 ENCOUNTER — Other Ambulatory Visit: Payer: Self-pay | Admitting: Medical

## 2023-06-24 ENCOUNTER — Telehealth: Payer: Self-pay | Admitting: Medical

## 2023-06-24 NOTE — Telephone Encounter (Signed)
Will do P.A. for Livalo,  we do have option of Zypitamag at [facility] drug if not approved.

## 2023-06-29 NOTE — Telephone Encounter (Signed)
P.A. PITAVASTATIN approved til 09/17/23, sent mychart message

## 2023-07-17 ENCOUNTER — Inpatient Hospital Stay: Payer: Medicare HMO

## 2023-07-29 ENCOUNTER — Other Ambulatory Visit: Payer: Self-pay | Admitting: Medical

## 2023-07-29 MED ORDER — FENOFIBRATE 48 MG PO TABS: 48.0000 mg | ORAL_TABLET | Freq: Every day | ORAL | 2 refills | Status: DC

## 2023-08-13 ENCOUNTER — Other Ambulatory Visit: Payer: Self-pay | Admitting: Medical

## 2023-08-13 DIAGNOSIS — E039 Hypothyroidism, unspecified: Secondary | ICD-10-CM

## 2023-08-13 MED ORDER — LEVOTHYROXINE SODIUM 75 MCG PO TABS
ORAL_TABLET | ORAL | 1 refills | Status: DC
Start: 2023-08-13 — End: 2024-03-30

## 2023-10-04 ENCOUNTER — Other Ambulatory Visit: Payer: Self-pay | Admitting: Medical

## 2023-10-04 DIAGNOSIS — E039 Hypothyroidism, unspecified: Secondary | ICD-10-CM

## 2023-10-08 ENCOUNTER — Inpatient Hospital Stay: Payer: Medicare HMO

## 2023-10-15 ENCOUNTER — Inpatient Hospital Stay: Payer: Medicare HMO | Admitting: Physician Assistant

## 2023-10-27 ENCOUNTER — Other Ambulatory Visit: Payer: Self-pay | Admitting: Medical

## 2023-10-28 NOTE — Telephone Encounter (Signed)
 Looks like patient developed rash from taking this.

## 2023-11-05 ENCOUNTER — Other Ambulatory Visit: Payer: Self-pay | Admitting: Medical

## 2023-11-05 MED ORDER — BENZONATATE 100 MG PO CAPS: 100.0000 mg | ORAL_CAPSULE | Freq: Four times a day (QID) | ORAL | 0 refills | Status: DC | PRN

## 2023-11-05 MED ORDER — DOXYCYCLINE HYCLATE 100 MG PO TABS: 100.0000 mg | ORAL_TABLET | Freq: Two times a day (BID) | ORAL | 0 refills | Status: DC

## 2023-11-11 ENCOUNTER — Inpatient Hospital Stay: Payer: Medicare HMO

## 2023-11-12 ENCOUNTER — Inpatient Hospital Stay: Payer: Medicare HMO | Admitting: Physician Assistant

## 2023-11-12 ENCOUNTER — Inpatient Hospital Stay: Payer: Medicare HMO | Attending: Hematology

## 2023-11-12 DIAGNOSIS — D75839 Thrombocytosis, unspecified: Secondary | ICD-10-CM | POA: Insufficient documentation

## 2023-11-12 DIAGNOSIS — Z1589 Genetic susceptibility to other disease: Secondary | ICD-10-CM

## 2023-11-12 DIAGNOSIS — D72829 Elevated white blood cell count, unspecified: Secondary | ICD-10-CM | POA: Diagnosis not present

## 2023-11-12 LAB — COMPREHENSIVE METABOLIC PANEL
ALT: 10 U/L (ref 0–44)
AST: 22 U/L (ref 15–41)
Albumin: 3.5 g/dL (ref 3.5–5.0)
Alkaline Phosphatase: 49 U/L (ref 38–126)
Anion gap: 9 (ref 5–15)
BUN: 11 mg/dL (ref 8–23)
CO2: 24 mmol/L (ref 22–32)
Calcium: 9.4 mg/dL (ref 8.9–10.3)
Chloride: 105 mmol/L (ref 98–111)
Creatinine, Ser: 0.72 mg/dL (ref 0.44–1.00)
GFR, Estimated: >60 mL/min (ref >60)
Glucose, Bld: 96 mg/dL (ref 70–99)
Potassium: 3.3 mmol/L — ABNORMAL LOW (ref 3.5–5.1)
Sodium: 138 mmol/L (ref 135–145)
Total Bilirubin: 1.4 mg/dL — ABNORMAL HIGH (ref 0.0–1.2)
Total Protein: 6.7 g/dL (ref 6.5–8.1)

## 2023-11-12 LAB — CBC WITH DIFFERENTIAL/PLATELET
Abs Immature Granulocytes: 0.08 10*3/uL — ABNORMAL HIGH (ref 0.00–0.07)
Basophils Absolute: 0.1 10*3/uL (ref 0.0–0.1)
Basophils Relative: 1 %
Eosinophils Absolute: 0.1 10*3/uL (ref 0.0–0.5)
Eosinophils Relative: 1 %
HCT: 39.6 % (ref 36.0–46.0)
Hemoglobin: 12.8 g/dL (ref 12.0–15.0)
Immature Granulocytes: 1 %
Lymphocytes Relative: 13 %
Lymphs Abs: 1.7 10*3/uL (ref 0.7–4.0)
MCH: 29.4 pg (ref 26.0–34.0)
MCHC: 32.3 g/dL (ref 30.0–36.0)
MCV: 91 fL (ref 80.0–100.0)
Monocytes Absolute: 1.2 10*3/uL — ABNORMAL HIGH (ref 0.1–1.0)
Monocytes Relative: 9 %
Neutro Abs: 10.1 10*3/uL — ABNORMAL HIGH (ref 1.7–7.7)
Neutrophils Relative %: 75 %
Platelets: 422 10*3/uL — ABNORMAL HIGH (ref 150–400)
RBC: 4.35 MIL/uL (ref 3.87–5.11)
RDW: 12.7 % (ref 11.5–15.5)
WBC: 13.2 10*3/uL — ABNORMAL HIGH (ref 4.0–10.5)
nRBC: 0 % (ref 0.0–0.2)

## 2023-11-12 LAB — URIC ACID: Uric Acid, Serum: 4.4 mg/dL (ref 2.5–7.1)

## 2023-11-12 LAB — LACTATE DEHYDROGENASE: LDH: 130 U/L (ref 98–192)

## 2023-11-16 NOTE — Progress Notes (Unsigned)
 [facility] [address], [place] [address]   CLINIC:  Medical Oncology/Hematology  PCP:  Genia Del [address] [place] [address] [phone]   REASON FOR VISIT:  Follow-up for JAK2+ leukocytosis and hypercalcemia   PRIOR THERAPY: None   CURRENT THERAPY: Surveillance   INTERVAL HISTORY:   Allison Koch 68 y.o. female returns for routine follow-up of JAK2+ leukocytosis and mild hypercalcemia.  She was last seen by Rojelio Brenner PA-C on 04/15/2023.  Overall, she is feeling fairly well.  *** She denies any changes in her symptoms or baseline health status since her last visit. *** She denies any recent infections or steroids. *** She denies any fever, night sweats, weight loss, or chills.    *** She has not noticed any new lumps or bumps. *** She denies any vasomotor symptoms such as aquagenic pruritus, Raynaud's phenomenon, erythromelalgia, dizziness, transient vision changes, or new onset peripheral neuropathy.  *** *** No prior history of DVT or PE, and no current symptoms concerning for DVT or PE.  *** *** She is taking Caltrate supplement once daily.     *** She has 90***% energy and 100***% appetite. She endorses that she is maintaining a stable weight.   ASSESSMENT & PLAN:  1.  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 - She denies any history of splenectomy.  No systemic steroids.  No history of connective tissue disorders. 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 Normal LDH, normal reticulocytes.  Rheumatoid factor and ANA negative.  Normal ESR and CRP. - Most recent CBC (11/12/2023): WBC 13.2, ANC 10.1, platelets 422.  Hgb 12.8/hematocrit 39.6%.  Normal LDH.  Normal uric acid.  Previously noted  elevation in AST/ALT has resolved. - No palpable splenomegaly or lymphadenopathy on exam.  *** - No vasomotor symptoms or B symptoms.  *** No recent infections.  *** - We have discussed that she may have very early signs of MPN, with associated risk of thromboembolic events, myelofibrosis, or leukemic transformation. - PLAN: Intermittent leukocytosis overall stable.  Mild intermittent thrombocytosis over the past 6 months. - We will hold off on cytoreductive therapy (Hydrea) at this time, but will check monthly CBC/D x 3 months, and will start Hydrea if platelets persistently >400, hematocrit >45%, or WBC >20 - START taking aspirin 81 mg daily *** - Labs (CBC/D, CMP, LDH) + RTC in 4 months.   2.  Mild hypercalcemia, RESOLVED - Previously had mild hypercalcemia related to taking Caltrate twice daily - Other workup for hypercalcemia was negative - Hypercalcemia resolved after Caltrate reduced to once daily   3.  Other history - Lives at home with husband.  Retired from office type work.  No chemical exposure.  Non-smoker. - Mother had colon cancer.  Paternal aunt had cancer in her foot.  Maternal aunt had brain cancer.`  PLAN SUMMARY:*** >> Monthly CBC/D x 3 >> Labs in 4 months = CBC/D, CMP, LDH >> OFFICE visit in 4 months (after labs)    REVIEW OF SYSTEMS: ***  Review of Systems  Constitutional:  Negative for appetite change, chills, diaphoresis, fatigue, fever and unexpected weight change.  HENT:   Negative for lump/mass and nosebleeds.   Eyes:  Negative for eye problems.  Respiratory:  Negative for cough, hemoptysis and shortness of breath.   Cardiovascular:  Negative for chest pain, leg swelling and palpitations.  Gastrointestinal:  Negative for abdominal pain, blood in stool, constipation, diarrhea, nausea and vomiting.  Genitourinary:  Negative for hematuria.   Skin: Negative.   Neurological:  Negative for dizziness, headaches and light-headedness.  Hematological:  Does not  bruise/bleed easily.     PHYSICAL EXAM:  ECOG PERFORMANCE STATUS: 0 - Asymptomatic *** There were no vitals filed for this visit.  There were no vitals filed for this visit.  Physical Exam Constitutional:      Appearance: Normal appearance.  HENT:     Head: Normocephalic and atraumatic.     Mouth/Throat:     Mouth: Mucous membranes are moist.  Eyes:     Extraocular Movements: Extraocular movements intact.     Pupils: Pupils are equal, round, and reactive to light.  Cardiovascular:     Rate and Rhythm: Normal rate and regular rhythm.     Pulses: Normal pulses.     Heart sounds: Normal heart sounds.  Pulmonary:     Effort: Pulmonary effort is normal.     Breath sounds: Normal breath sounds.  Abdominal:     General: Bowel sounds are normal.     Palpations: Abdomen is soft. There is no splenomegaly.     Tenderness: There is no abdominal tenderness.  Musculoskeletal:        General: No swelling.     Right lower leg: No edema.     Left lower leg: No edema.  Lymphadenopathy:     Head:     Right side of head: No submental, submandibular, tonsillar, preauricular, posterior auricular or occipital adenopathy.     Left side of head: No submental, submandibular, tonsillar, preauricular, posterior auricular or occipital adenopathy.     Cervical: No cervical adenopathy.     Right cervical: No superficial, deep or posterior cervical adenopathy.    Left cervical: No superficial, deep or posterior cervical adenopathy.     Upper Body:     Right upper body: No supraclavicular adenopathy.     Left upper body: No supraclavicular adenopathy.  Skin:    General: Skin is warm and dry.  Neurological:     General: No focal deficit present.     Mental Status: She is alert and oriented to person, place, and time.  Psychiatric:        Mood and Affect: Mood normal.        Behavior: Behavior normal.    PAST MEDICAL/SURGICAL HISTORY:  Past Medical History:  Diagnosis Date   Hypothyroidism     PAF (paroxysmal atrial fibrillation) (HCC)    Noted in setting of acute illness   Tubular adenoma 07/2019   colonoscopy, Dr. Erick Blinks   Past Surgical History:  Procedure Laterality Date   COLONOSCOPY  07/2019   Dr. Erick Blinks, tubular adenoma, repeat 5 years   EYE SURGERY     TONSILLECTOMY     UTERINE FIBROID SURGERY     WISDOM TOOTH EXTRACTION      SOCIAL HISTORY:  Social History   Socioeconomic History   Marital status: Married    Spouse name: Not on file   Number of children: Not on file   Years of education: Not on file   Highest education level: Not on file  Occupational History   Not on file  Tobacco Use   Smoking status: Never   Smokeless tobacco: Never  Vaping Use   Vaping status: Never Used  Substance and Sexual Activity   Alcohol  use: No   Drug use: No   Sexual activity: Not on file  Other Topics Concern   Not on file  Social History Narrative   Advertising account executive 4H camp, [place]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   Social Drivers of Health   Financial Resource Strain: Low Risk  (02/12/2023)   Overall Financial Resource Strain (CARDIA)    Difficulty of Paying Living Expenses: Not hard at all  Food Insecurity: No Food Insecurity (02/12/2023)   Hunger Vital Sign    Worried About Running Out of Food in the Last Year: Never true    Ran Out of Food in the Last Year: Never true  Transportation Needs: No Transportation Needs (02/12/2023)   PRAPARE - Administrator, Civil Service (Medical): No    Lack of Transportation (Non-Medical): No  Physical Activity: Sufficiently Active (01/15/2023)   Exercise Vital Sign    Days of Exercise per Week: 4 days    Minutes of Exercise per Session: 60 min  Stress: No Stress Concern Present (02/12/2023)   [employer] of Occupational Health - Occupational Stress Questionnaire    Feeling of Stress : Not at all  Social Connections: Socially Integrated (02/12/2023)   Social Connection and Isolation Panel [NHANES]    Frequency of  Communication with Friends and Family: More than three times a week    Frequency of Social Gatherings with Friends and Family: More than three times a week    Attends Religious Services: More than 4 times per year    Active Member of [employer] or Organizations: Yes    Attends Banker Meetings: Not on file    Marital Status: Married  Intimate Partner Violence: Not At Risk (02/12/2023)   Humiliation, Afraid, Rape, and Kick questionnaire    Fear of Current or Ex-Partner: No    Emotionally Abused: No    Physically Abused: No    Sexually Abused: No    FAMILY HISTORY:  Family History  Problem Relation Age of Onset   Arrhythmia Mother        atrial fib   Colon cancer Mother 70   Dementia Mother    COPD Mother    Cancer Mother    Heart attack Father        CABG x9, maybe in 73s   Heart failure Father        died of CHF   Diabetes Father    Esophageal cancer Neg Hx    Rectal cancer Neg Hx    Stomach cancer Neg Hx    Heart disease Neg Hx     CURRENT MEDICATIONS:  Outpatient Encounter Medications as of 11/18/2023  Medication Sig Note   benzonatate (TESSALON PERLES) 100 MG capsule Take 1 capsule (100 mg total) by mouth every 6 (six) hours as needed for cough.    Calcium Carbonate-Vitamin D 600-400 MG-UNIT per chew tablet Chew 1 tablet by mouth 2 (two) times daily.    doxycycline (VIBRA-TABS) 100 MG tablet Take 1 tablet (100 mg total) by mouth 2 (two) times daily.    estradiol (VIVELLE-DOT) 0.05 MG/24HR patch estradiol 0.05 mg/24 hr semiweekly transdermal patch  APPLY ONE PATCH ON THE SKIN TWICE A WEEK 01/17/2022: Changed mg 9.05 mg patch   ibuprofen (ADVIL) 200 MG tablet Take 200 mg by mouth every 6 (six) hours as needed. 06/20/2023: Took one last night 3:25am   levothyroxine (SYNTHROID) 75 MCG tablet TAKE 1 TABLET BY MOUTH EVERY DAY BEFORE BREAKFAST    progesterone (PROMETRIUM) 100 MG capsule Take  100 mg by mouth at bedtime.    No facility-administered encounter medications on  file as of 11/18/2023.    ALLERGIES:  Allergies  Allergen Reactions   Fenofibrate     rash   Other     Mycins per patient.   Penicillins    Statins     Myalgias and arthralgias   Sulfa Antibiotics Other (See Comments)    LABORATORY DATA:  I have reviewed the labs as listed.  CBC    Component Value Date/Time   WBC 13.2 (H) 11/12/2023 1023   RBC 4.35 11/12/2023 1023   HGB 12.8 11/12/2023 1023   HGB 14.1 02/12/2023 1427   HCT 39.6 11/12/2023 1023   HCT 42.8 02/12/2023 1427   PLT 422 (H) 11/12/2023 1023   PLT 425 02/12/2023 1427   MCV 91.0 11/12/2023 1023   MCV 89 02/12/2023 1427   MCH 29.4 11/12/2023 1023   MCHC 32.3 11/12/2023 1023   RDW 12.7 11/12/2023 1023   RDW 12.8 02/12/2023 1427   LYMPHSABS 1.7 11/12/2023 1023   LYMPHSABS 2.0 02/12/2023 1427   MONOABS 1.2 (H) 11/12/2023 1023   EOSABS 0.1 11/12/2023 1023   EOSABS 0.1 02/12/2023 1427   BASOSABS 0.1 11/12/2023 1023   BASOSABS 0.1 02/12/2023 1427      Latest Ref Rng & Units 11/12/2023   10:23 AM 05/16/2023   10:54 AM 04/08/2023    1:25 PM  CMP  Glucose 70 - 99 mg/dL 96  92  161   BUN 8 - 23 mg/dL 11  10  15    Creatinine 0.44 - 1.00 mg/dL 0.96  0.45  4.09   Sodium 135 - 145 mmol/L 138  136  134   Potassium 3.5 - 5.1 mmol/L 3.3  3.4  3.6   Chloride 98 - 111 mmol/L 105  104  98   CO2 22 - 32 mmol/L 24  26  28    Calcium 8.9 - 10.3 mg/dL 9.4  9.2  81.1   Total Protein 6.5 - 8.1 g/dL 6.7  6.3  6.9   Total Bilirubin 0.0 - 1.2 mg/dL 1.4  1.0  0.9   Alkaline Phos 38 - 126 U/L 49  47  42   AST 15 - 41 U/L 22  26  71   ALT 0 - 44 U/L 10  16  52     DIAGNOSTIC IMAGING:  I have independently reviewed the relevant imaging and discussed with the patient.   WRAP UP:  All questions were answered. The patient knows to call the clinic with any problems, questions or concerns.  Medical decision making: Moderate***  Time spent on visit: I spent 20*** minutes counseling the patient face to face. The total time spent in  the appointment was 30*** minutes and more than 50% was on counseling.  Carnella Guadalajara, PA-C  ***

## 2023-11-18 ENCOUNTER — Inpatient Hospital Stay: Payer: Medicare HMO | Attending: Hematology | Admitting: Physician Assistant

## 2023-11-18 VITALS — BP 148/75 | HR 89 | Temp 99.2°F | Resp 18 | Ht 65.0 in | Wt 117.0 lb

## 2023-11-18 DIAGNOSIS — D72828 Other elevated white blood cell count: Secondary | ICD-10-CM | POA: Insufficient documentation

## 2023-11-18 DIAGNOSIS — Z1589 Genetic susceptibility to other disease: Secondary | ICD-10-CM

## 2023-11-18 DIAGNOSIS — D72829 Elevated white blood cell count, unspecified: Secondary | ICD-10-CM | POA: Diagnosis not present

## 2023-11-18 DIAGNOSIS — D75839 Thrombocytosis, unspecified: Secondary | ICD-10-CM | POA: Insufficient documentation

## 2023-11-18 MED ORDER — ASPIRIN 81 MG PO TBEC: 81.0000 mg | DELAYED_RELEASE_TABLET | Freq: Every day | ORAL | 12 refills | Status: AC

## 2023-11-18 NOTE — Patient Instructions (Signed)
 [facility] at [facility] **VISIT SUMMARY & IMPORTANT INSTRUCTIONS **   You were seen today by Rojelio Brenner PA-C for your elevated white blood cells.    Elevated white blood cells & platelets Your white blood cells and platelets are both mildly elevated - this may be related to your JAK2 mutation. We will check your blood more closely over the next 3 months. Since your platelets have been more consistently elevated lately, I would like you to start taking aspirin 81 mg daily to decrease your risk of blood clot, heart attack, and strokes.  Elevated calcium Your calcium level has come back to normal. You can continue to take your Caltrate supplement, but should only take it ONCE daily.  FOLLOW-UP APPOINTMENT: Office visit in 4 months  ** Thank you for trusting me with your healthcare!  I strive to provide all of my patients with quality care at each visit.  If you receive a survey for this visit, I would be so grateful to you for taking the time to provide feedback.  Thank you in advance!  ~ Aayushi Solorzano                   Dr. Doreatha Massed   &   Rojelio Brenner, PA-C   - - - - - - - - - - - - - - - - - -    Thank you for choosing  [facility] at [facility] to provide your oncology and hematology care.  To afford each patient quality time with our provider, please arrive at least 15 minutes before your scheduled appointment time.   If you have a lab appointment with the Cancer Center please come in thru the Main Entrance and check in at the main information desk.  You need to re-schedule your appointment should you arrive 10 or more minutes late.  We strive to give you quality time with our providers, and arriving late affects you and other patients whose appointments are after yours.  Also, if you no show three or more times for appointments you may be dismissed from the clinic at the providers discretion.     Again, thank you for  choosing [facility].  Our hope is that these requests will decrease the amount of time that you wait before being seen by our physicians.       _____________________________________________________________  Should you have questions after your visit to [facility], please contact our office at [phone] and follow the prompts.  Our office hours are 8:00 a.m. and 4:30 p.m. Monday - Friday.  Please note that voicemails left after 4:00 p.m. may not be returned until the following business day.  We are closed weekends and major holidays.  You do have access to a nurse 24-7, just call the main number to the clinic [phone] and do not press any options, hold on the line and a nurse will answer the phone.    For prescription refill requests, have your pharmacy contact our office and allow 72 hours.

## 2023-11-22 ENCOUNTER — Other Ambulatory Visit: Payer: Self-pay | Admitting: Medical

## 2023-11-22 MED ORDER — CLARITHROMYCIN 500 MG PO TABS: 500.0000 mg | ORAL_TABLET | Freq: Two times a day (BID) | ORAL | 0 refills | Status: DC

## 2023-12-13 ENCOUNTER — Encounter: Payer: Self-pay | Admitting: Physician Assistant

## 2023-12-19 ENCOUNTER — Inpatient Hospital Stay: Attending: Hematology

## 2023-12-19 DIAGNOSIS — D72829 Elevated white blood cell count, unspecified: Secondary | ICD-10-CM

## 2023-12-19 DIAGNOSIS — D75839 Thrombocytosis, unspecified: Secondary | ICD-10-CM | POA: Insufficient documentation

## 2023-12-19 DIAGNOSIS — Z1589 Genetic susceptibility to other disease: Secondary | ICD-10-CM

## 2023-12-19 LAB — CBC WITH DIFFERENTIAL/PLATELET
Abs Immature Granulocytes: 0.04 10*3/uL (ref 0.00–0.07)
Basophils Absolute: 0.1 10*3/uL (ref 0.0–0.1)
Basophils Relative: 1 %
Eosinophils Absolute: 0.1 10*3/uL (ref 0.0–0.5)
Eosinophils Relative: 1 %
HCT: 39.9 % (ref 36.0–46.0)
Hemoglobin: 12.8 g/dL (ref 12.0–15.0)
Immature Granulocytes: 0 %
Lymphocytes Relative: 20 %
Lymphs Abs: 2.1 10*3/uL (ref 0.7–4.0)
MCH: 29.8 pg (ref 26.0–34.0)
MCHC: 32.1 g/dL (ref 30.0–36.0)
MCV: 92.8 fL (ref 80.0–100.0)
Monocytes Absolute: 0.9 10*3/uL (ref 0.1–1.0)
Monocytes Relative: 8 %
Neutro Abs: 7.6 10*3/uL (ref 1.7–7.7)
Neutrophils Relative %: 70 %
Platelets: 339 10*3/uL (ref 150–400)
RBC: 4.3 MIL/uL (ref 3.87–5.11)
RDW: 13.2 % (ref 11.5–15.5)
WBC: 10.8 10*3/uL — ABNORMAL HIGH (ref 4.0–10.5)
nRBC: 0 % (ref 0.0–0.2)

## 2023-12-30 ENCOUNTER — Encounter: Payer: Self-pay | Admitting: Physician Assistant

## 2024-01-03 DIAGNOSIS — H3562 Retinal hemorrhage, left eye: Secondary | ICD-10-CM | POA: Diagnosis not present

## 2024-01-03 DIAGNOSIS — H5310 Unspecified subjective visual disturbances: Secondary | ICD-10-CM | POA: Diagnosis not present

## 2024-01-03 DIAGNOSIS — H43392 Other vitreous opacities, left eye: Secondary | ICD-10-CM | POA: Diagnosis not present

## 2024-01-06 DIAGNOSIS — H43392 Other vitreous opacities, left eye: Secondary | ICD-10-CM | POA: Diagnosis not present

## 2024-01-06 DIAGNOSIS — H4312 Vitreous hemorrhage, left eye: Secondary | ICD-10-CM | POA: Diagnosis not present

## 2024-01-06 DIAGNOSIS — H59811 Chorioretinal scars after surgery for detachment, right eye: Secondary | ICD-10-CM | POA: Diagnosis not present

## 2024-01-06 DIAGNOSIS — H2513 Age-related nuclear cataract, bilateral: Secondary | ICD-10-CM | POA: Diagnosis not present

## 2024-01-06 DIAGNOSIS — H43812 Vitreous degeneration, left eye: Secondary | ICD-10-CM | POA: Diagnosis not present

## 2024-01-06 DIAGNOSIS — H33102 Unspecified retinoschisis, left eye: Secondary | ICD-10-CM | POA: Diagnosis not present

## 2024-01-17 ENCOUNTER — Inpatient Hospital Stay

## 2024-01-28 ENCOUNTER — Ambulatory Visit (INDEPENDENT_AMBULATORY_CARE_PROVIDER_SITE_OTHER): Payer: 59

## 2024-01-28 DIAGNOSIS — Z0000 Encounter for general adult medical examination without abnormal findings: Secondary | ICD-10-CM

## 2024-01-28 NOTE — Patient Instructions (Signed)
 [name] , Thank you for taking time out of your busy schedule to complete your Annual Wellness Visit with me. I enjoyed our conversation and look forward to speaking with you again next year. I, as well as your care team,  appreciate your ongoing commitment to your health goals. Please review the following plan we discussed and let me know if I can assist you in the future. Your Game plan/ To Do List    Referrals: If you haven't heard from the office you've been referred to, please reach out to them at the phone provided.  N/a Follow up Visits: Next Medicare AWV with our clinical staff: 02/02/2025 at 2:50   Have you seen your provider in the last 6 months (3 months if uncontrolled diabetes)? No Next Office Visit with your provider: Will call back to schedule once she looks to see when other appointments are scheduled.  Clinician Recommendations:  Aim for 30 minutes of exercise or brisk walking, 6-8 glasses of water, and 5 servings of fruits and vegetables each day.       This is a list of the screening recommended for you and due dates:  Health Maintenance  Topic Date Due   Zoster (Shingles) Vaccine (1 of 2) Never done   Mammogram  04/09/2024   Flu Shot  04/17/2024   Colon Cancer Screening  07/21/2024   Medicare Annual Wellness Visit  01/27/2025   DEXA scan (bone density measurement)  Completed   HPV Vaccine  Aged Out   Meningitis B Vaccine  Aged Out   DTaP/Tdap/Td vaccine  Discontinued   Pneumonia Vaccine  Discontinued   COVID-19 Vaccine  Discontinued   Hepatitis C Screening  Discontinued    Advanced directives: (Copy Requested) Please bring a copy of your health care power of attorney and living will to the office to be added to your chart at your convenience. You can mail to [facility] [address] [place], [place] [address] or email to [email or web address] Advance Care Planning is important because it:  [x]  Makes sure you receive the medical care  that is consistent with your values, goals, and preferences  [x]  It provides guidance to your family and loved ones and reduces their decisional burden about whether or not they are making the right decisions based on your wishes.  Follow the link provided in your after visit summary or read over the paperwork we have mailed to you to help you started getting your Advance Directives in place. If you need assistance in completing these, please reach out to us  so that we can help you!  See attachments for Preventive Care and Fall Prevention Tips.

## 2024-01-28 NOTE — Progress Notes (Signed)
 Subjective:   Allison Koch is a 68 y.o. who presents for a Medicare Wellness preventive visit.  As a reminder, Annual Wellness Visits don't include a physical exam, and some assessments may be limited, especially if this visit is performed virtually. We may recommend an in-person visit if needed.  Visit Complete: Virtual I connected with  Allison Koch on 01/28/24 by a audio enabled telemedicine application and verified that I am speaking with the correct person using two identifiers.  Patient Location: Home  Provider Location: Home Office  I discussed the limitations of evaluation and management by telemedicine. The patient expressed understanding and agreed to proceed.  Vital Signs: Because this visit was a virtual/telehealth visit, some criteria may be missing or patient reported. Any vitals not documented were not able to be obtained and vitals that have been documented are patient reported.  VideoError- Librarian, academic were attempted between this provider and patient, however failed, due to patient having technical difficulties OR patient did not have access to video capability.  We continued and completed visit with audio only.   Persons Participating in Visit: Patient.  AWV Questionnaire: Yes: Patient Medicare AWV questionnaire was completed by the patient on 01/27/2024; I have confirmed that all information answered by patient is correct and no changes since this date.  Cardiac Risk Factors include: advanced age (>24men, >93 women)     Objective:     Today's Vitals   There is no height or weight on file to calculate BMI.     01/28/2024    2:56 PM 11/18/2023    1:49 PM 04/15/2023   12:50 PM 01/15/2023    3:03 PM 06/22/2022   11:02 AM 03/21/2022    9:32 AM 02/23/2022   11:27 AM  Advanced Directives  Does Patient Have a Medical Advance Directive? Yes No Yes Yes Yes Yes No  Type of Advance Directive Living will  Healthcare Power of  [place];Living will Healthcare Power of [place];Living will Healthcare Power of [place];Living will Healthcare Power of [place];Living will   Does patient want to make changes to medical advance directive?   No - Patient declined  No - Patient declined No - Patient declined No - Patient declined  Copy of Healthcare Power of Attorney in Chart?   No - copy requested No - copy requested No - copy requested    Would patient like information on creating a medical advance directive?  No - Patient declined No - Patient declined  No - Patient declined No - Patient declined No - Patient declined    Current Medications (verified) Outpatient Encounter Medications as of 01/28/2024  Medication Sig   aspirin  EC 81 MG tablet Take 1 tablet (81 mg total) by mouth daily. Swallow whole.   Calcium  Carbonate-Vitamin D  600-400 MG-UNIT per chew tablet Chew 1 tablet by mouth 2 (two) times daily.   estradiol  (VIVELLE -DOT) 0.05 MG/24HR patch estradiol  0.05 mg/24 hr semiweekly transdermal patch  APPLY ONE PATCH ON THE SKIN TWICE A WEEK   levothyroxine  (SYNTHROID ) 75 MCG tablet TAKE 1 TABLET BY MOUTH EVERY DAY BEFORE BREAKFAST   progesterone (PROMETRIUM) 100 MG capsule Take 100 mg by mouth at bedtime.   clarithromycin  (BIAXIN ) 500 MG tablet Take 1 tablet (500 mg total) by mouth 2 (two) times daily.   No facility-administered encounter medications on file as of 01/28/2024.    Allergies (verified) Other, Penicillins, Statins, and Sulfa antibiotics   History: Past Medical History:  Diagnosis Date   Hypothyroidism  PAF (paroxysmal atrial fibrillation) (HCC)    Noted in setting of acute illness   Tubular adenoma 07/2019   colonoscopy, Dr. Laurell Pond   Past Surgical History:  Procedure Laterality Date   COLONOSCOPY  07/2019   Dr. Laurell Pond, tubular adenoma, repeat 5 years   EYE SURGERY     TONSILLECTOMY     UTERINE FIBROID SURGERY     WISDOM TOOTH EXTRACTION     Family History  Problem Relation Age of  Onset   Arrhythmia Mother        atrial fib   Colon cancer Mother 28   Dementia Mother    COPD Mother    Cancer Mother    Heart attack Father        CABG x9, maybe in 30s   Heart failure Father        died of CHF   Diabetes Father    Esophageal cancer Neg Hx    Rectal cancer Neg Hx    Stomach cancer Neg Hx    Heart disease Neg Hx    Social History   Socioeconomic History   Marital status: Married    Spouse name: Not on file   Number of children: Not on file   Years of education: Not on file   Highest education level: Not on file  Occupational History   Not on file  Tobacco Use   Smoking status: Never   Smokeless tobacco: Never  Vaping Use   Vaping status: Never Used  Substance and Sexual Activity   Alcohol use: No   Drug use: No   Sexual activity: Not on file  Other Topics Concern   Not on file  Social History Narrative   Advertising account executive 4H camp, [place]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   Social Drivers of Health   Financial Resource Strain: Low Risk  (01/28/2024)   Overall Financial Resource Strain (CARDIA)    Difficulty of Paying Living Expenses: Not hard at all  Food Insecurity: No Food Insecurity (01/28/2024)   Hunger Vital Sign    Worried About Running Out of Food in the Last Year: Never true    Ran Out of Food in the Last Year: Never true  Transportation Needs: No Transportation Needs (01/28/2024)   PRAPARE - Administrator, Civil Service (Medical): No    Lack of Transportation (Non-Medical): No  Physical Activity: Sufficiently Active (01/28/2024)   Exercise Vital Sign    Days of Exercise per Week: 4 days    Minutes of Exercise per Session: 60 min  Stress: No Stress Concern Present (01/28/2024)   [employer] of Occupational Health - Occupational Stress Questionnaire    Feeling of Stress : Not at all  Social Connections: Socially Integrated (01/28/2024)   Social Connection and Isolation Panel [NHANES]    Frequency of Communication with Friends and Family:  Twice a week    Frequency of Social Gatherings with Friends and Family: Twice a week    Attends Religious Services: More than 4 times per year    Active Member of [employer] or Organizations: Yes    Attends Engineer, structural: More than 4 times per year    Marital Status: Married    Tobacco Counseling Counseling given: Not Answered    Clinical Intake:  Pre-visit preparation completed: Yes  Pain : No/denies pain     Nutritional Risks: None Diabetes: No  No results found for: "HGBA1C"   How often do you need to have someone help you when  you read instructions, pamphlets, or other written materials from your doctor or pharmacy?: 1 - Never  Interpreter Needed?: No  Information entered by :: NAllen LPN   Activities of Daily Living     01/27/2024    3:54 PM  In your present state of health, do you have any difficulty performing the following activities:  Hearing? 0  Vision? 0  Difficulty concentrating or making decisions? 0  Walking or climbing stairs? 0  Dressing or bathing? 0  Doing errands, shopping? 0  Preparing Food and eating ? N  Using the Toilet? N  In the past six months, have you accidently leaked urine? N  Do you have problems with loss of bowel control? N  Managing your Medications? N  Managing your Finances? N  Housekeeping or managing your Housekeeping? N    Patient Care Team: Tysinger, Christiane Cowing, PA-C as PCP - General (Family Medicine) Gerard Knight, MD as PCP - Cardiology (Cardiology) Paulett Boros, MD as Medical Oncologist (Hematology) Woodrow Hazy, MD as Consulting Physician (Obstetrics and Gynecology)  Indicate any recent Medical Services you may have received from other than [facility] providers in the past year (date may be approximate).     Assessment:    This is a routine wellness examination for [employer].  Hearing/Vision screen Hearing Screening - Comments:: Denies hearing issues Vision Screening - Comments:: Regular eye  exams, [facility]   Goals Addressed             This Visit's Progress    Patient Stated       01/28/2024, stay healthy       Depression Screen     01/28/2024    2:57 PM 02/12/2023    1:34 PM 01/15/2023    3:04 PM 05/28/2022    9:46 AM 01/17/2022   10:32 AM 01/05/2022    3:04 PM 06/30/2020   12:05 PM  PHQ 2/9 Scores  PHQ - 2 Score 0 0 0 0 0 0 0  PHQ- 9 Score 0  0   0 0    Fall Risk     01/27/2024    3:54 PM 02/12/2023    1:34 PM 01/15/2023    3:04 PM 05/28/2022    9:46 AM 01/17/2022   10:32 AM  Fall Risk   Falls in the past year? 0 0 0 0 0  Number falls in past yr: 0 0 0 0 0  Injury with Fall? 0 0 0 0 0  Risk for fall due to : Medication side effect No Fall Risks Medication side effect No Fall Risks No Fall Risks  Follow up Falls evaluation completed;Falls prevention discussed Falls evaluation completed Falls prevention discussed;Education provided;Falls evaluation completed Falls evaluation completed Falls evaluation completed    MEDICARE RISK AT HOME:  Medicare Risk at Home Any stairs in or around the home?: (Patient-Rptd) Yes If so, are there any without handrails?: (Patient-Rptd) No Home free of loose throw rugs in walkways, pet beds, electrical cords, etc?: (Patient-Rptd) Yes Adequate lighting in your home to reduce risk of falls?: (Patient-Rptd) Yes Life alert?: (Patient-Rptd) No Use of a cane, walker or w/c?: (Patient-Rptd) No Grab bars in the bathroom?: (Patient-Rptd) No Shower chair or bench in shower?: (Patient-Rptd) No Elevated toilet seat or a handicapped toilet?: (Patient-Rptd) No  TIMED UP AND GO:  Was the test performed?  No  Cognitive Function: 6CIT completed        01/28/2024    2:57 PM 01/15/2023    3:06 PM 01/05/2022  3:05 PM  6CIT Screen  What Year? 0 points 0 points 0 points  What month? 0 points 0 points 0 points  What time? 0 points 0 points 0 points  Count back from 20 0 points 0 points 0 points  Months in reverse 0 points 0 points 0  points  Repeat phrase 0 points 0 points 0 points  Total Score 0 points 0 points 0 points    Immunizations Immunization History  Administered Date(s) Administered   PFIZER(Purple Top)SARS-COV-2 Vaccination 03/24/2020, 04/15/2020    Screening Tests Health Maintenance  Topic Date Due   Zoster Vaccines- Shingrix (1 of 2) Never done   MAMMOGRAM  04/09/2024   INFLUENZA VACCINE  04/17/2024   Colonoscopy  07/21/2024   Medicare Annual Wellness (AWV)  01/27/2025   DEXA SCAN  Completed   HPV VACCINES  Aged Out   Meningococcal B Vaccine  Aged Out   DTaP/Tdap/Td  Discontinued   Pneumonia Vaccine 65+ Years old  Discontinued   COVID-19 Vaccine  Discontinued   Hepatitis C Screening  Discontinued    Health Maintenance  Health Maintenance Due  Topic Date Due   Zoster Vaccines- Shingrix (1 of 2) Never done   Health Maintenance Items Addressed: Declines vaccines.  Additional Screening:  Vision Screening: Recommended annual ophthalmology exams for early detection of glaucoma and other disorders of the eye.  Dental Screening: Recommended annual dental exams for proper oral hygiene  Community Resource Referral / Chronic Care Management: CRR required this visit?  No   CCM required this visit?  No   Plan:    I have personally reviewed and noted the following in the patient's chart:   Medical and social history Use of alcohol, tobacco or illicit drugs  Current medications and supplements including opioid prescriptions. Patient is not currently taking opioid prescriptions. Functional ability and status Nutritional status Physical activity Advanced directives List of other physicians Hospitalizations, surgeries, and ER visits in previous 12 months Vitals Screenings to include cognitive, depression, and falls Referrals and appointments  In addition, I have reviewed and discussed with patient certain preventive protocols, quality metrics, and best practice recommendations. A  written personalized care plan for preventive services as well as general preventive health recommendations were provided to patient.   Areatha Beecham, LPN   04/27/9146   After Visit Summary: (MyChart) Due to this being a telephonic visit, the after visit summary with patients personalized plan was offered to patient via MyChart   Notes: Nothing significant to report at this time.

## 2024-02-05 DIAGNOSIS — H2513 Age-related nuclear cataract, bilateral: Secondary | ICD-10-CM | POA: Diagnosis not present

## 2024-02-05 DIAGNOSIS — H4312 Vitreous hemorrhage, left eye: Secondary | ICD-10-CM | POA: Diagnosis not present

## 2024-02-05 DIAGNOSIS — H33102 Unspecified retinoschisis, left eye: Secondary | ICD-10-CM | POA: Diagnosis not present

## 2024-02-05 DIAGNOSIS — H43392 Other vitreous opacities, left eye: Secondary | ICD-10-CM | POA: Diagnosis not present

## 2024-02-05 DIAGNOSIS — H59811 Chorioretinal scars after surgery for detachment, right eye: Secondary | ICD-10-CM | POA: Diagnosis not present

## 2024-02-05 DIAGNOSIS — H43812 Vitreous degeneration, left eye: Secondary | ICD-10-CM | POA: Diagnosis not present

## 2024-02-17 ENCOUNTER — Inpatient Hospital Stay

## 2024-02-17 IMAGING — CT CT CARDIAC CORONARY ARTERY CALCIUM SCORE
3 series · 14 of 20 positions shown, 16 images · non-contrast
Comparison: 03/06/2004

CLINICAL DATA: 66-year-old Caucasian female with family history of
heart disease.

EXAM:
CT CARDIAC CORONARY ARTERY CALCIUM SCORE
TECHNIQUE: Non-contrast imaging through the heart was performed using
prospective ECG gating. Image post processing was performed on an
independent workstation, allowing for quantitative analysis of the
heart and coronary arteries. Note that this exam targets the heart
and the chest was not imaged in its entirety.

[Series 3: calcium scoring 2.00 qr36 bestdiast 70% hrt calciu · axial · 0.35mm/px · z∈[+1715,+1811]mm · 4 of 80 slices shown]
[im 16/80  vessel]
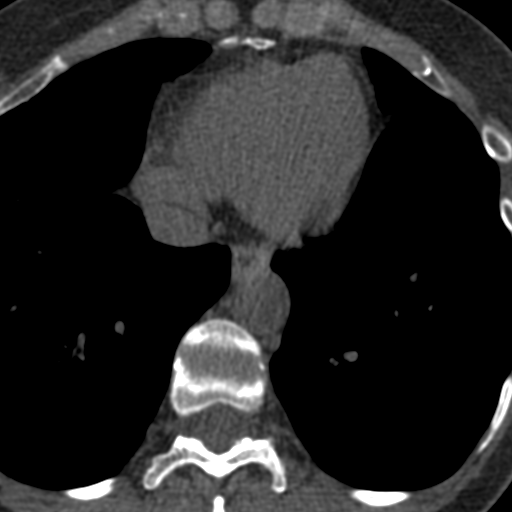
[im 32/80  vessel]
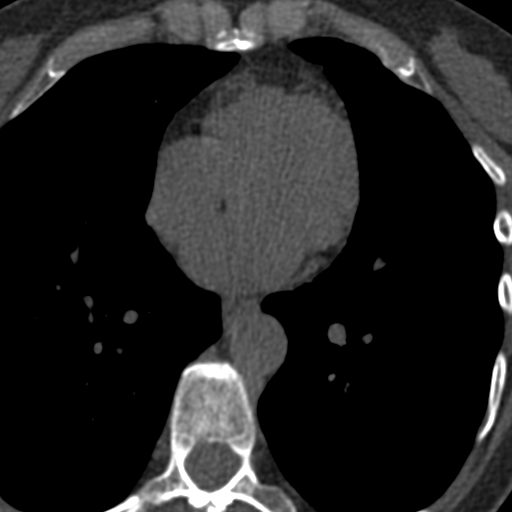
[im 48/80  vessel]
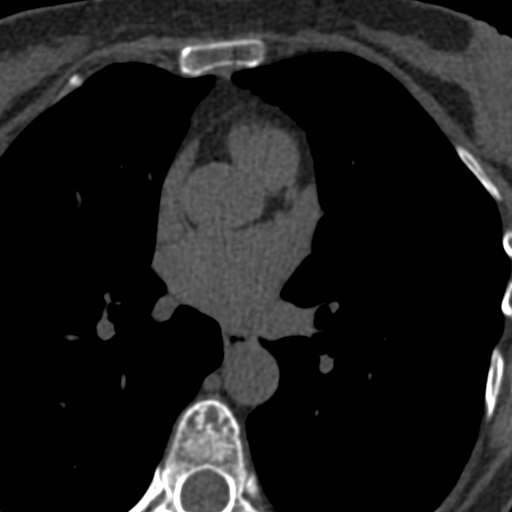
[im 64/80  vessel]
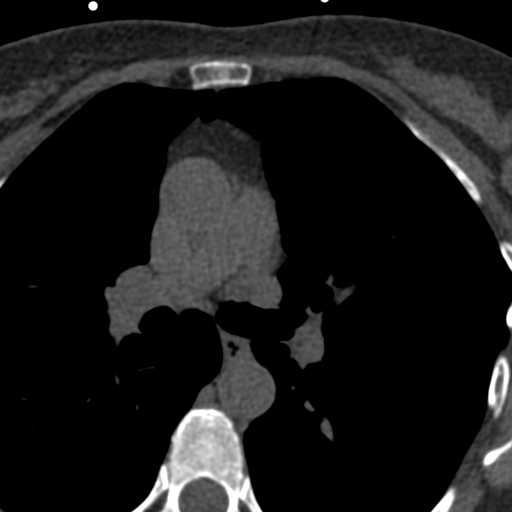

[Series 4: calcium scoring 2.00 br40 bestdiast 70% axial · axial · 0.51mm/px · z∈[+1711,+1815]mm · 5 of 80 slices shown, 7 images]
[im 14/80  vessel]
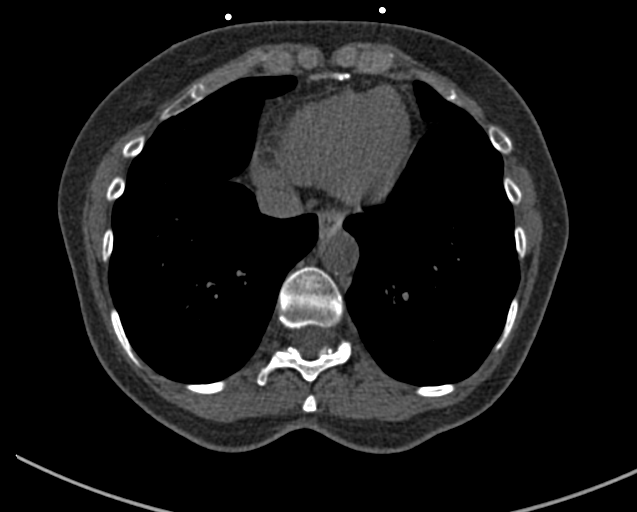
[im 14/80  lung]
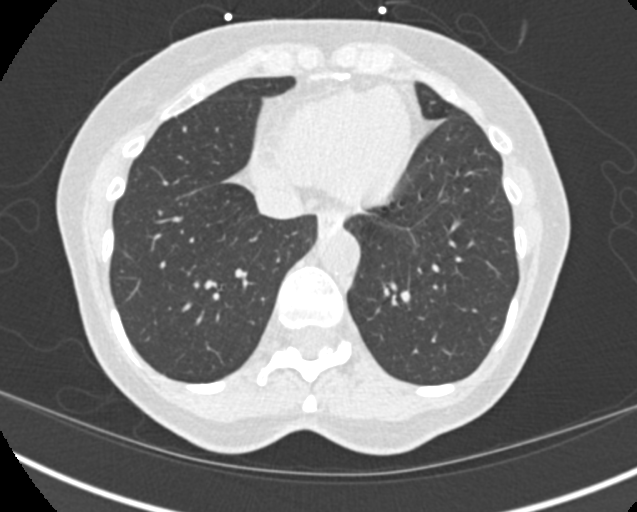
[im 27/80  vessel]
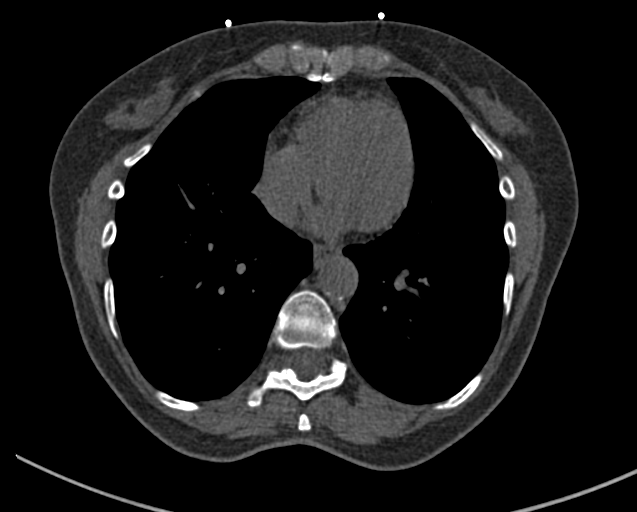
[im 40/80  vessel]
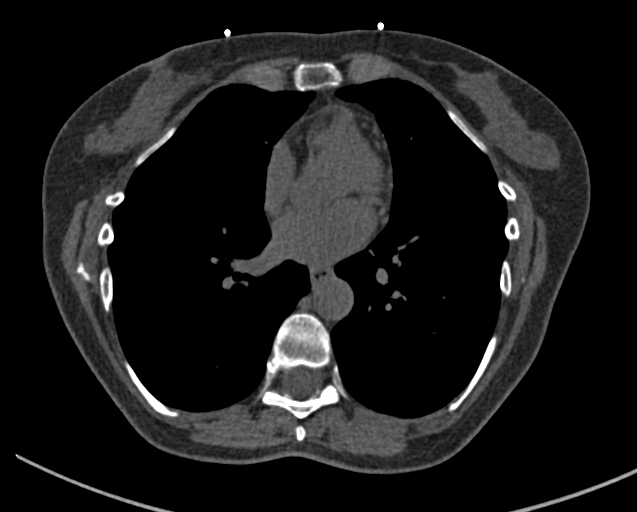
[im 53/80  vessel]
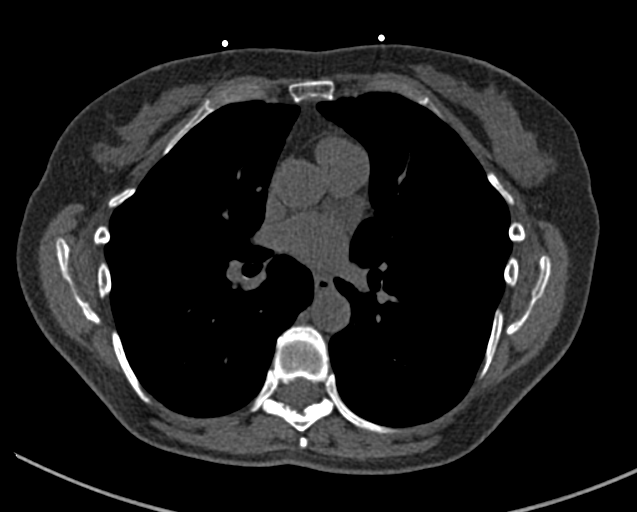
[im 66/80  vessel]
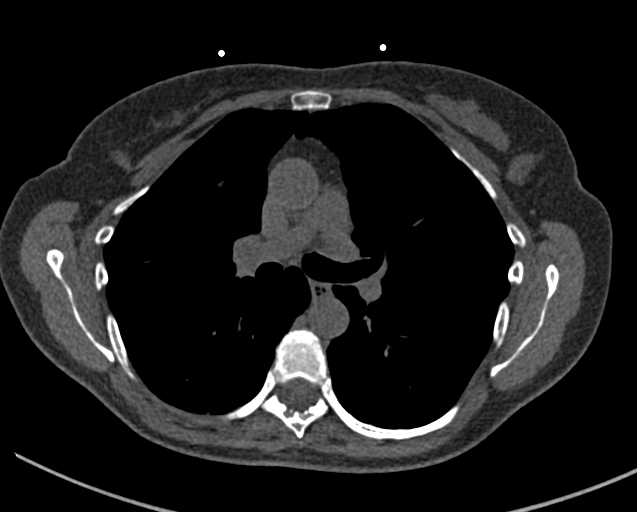
[im 66/80  lung]
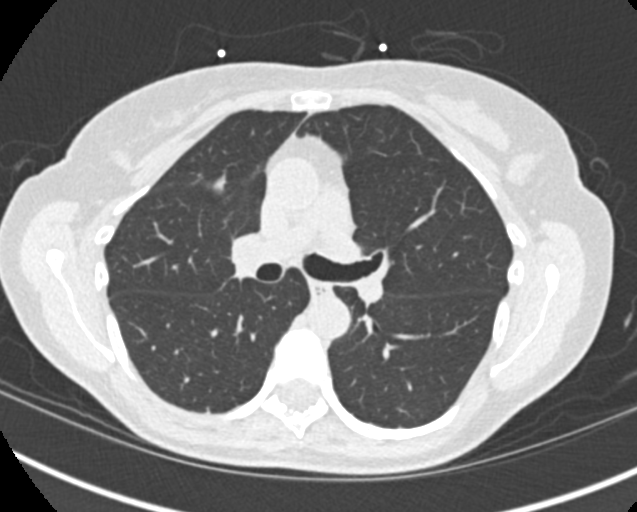

[Series 10: calcium scoring 2.00 br60 bestdiast 70% lungs · axial · 0.51mm/px · z∈[+1711,+1815]mm · 5 of 80 slices shown]
[im 14/80  vessel]
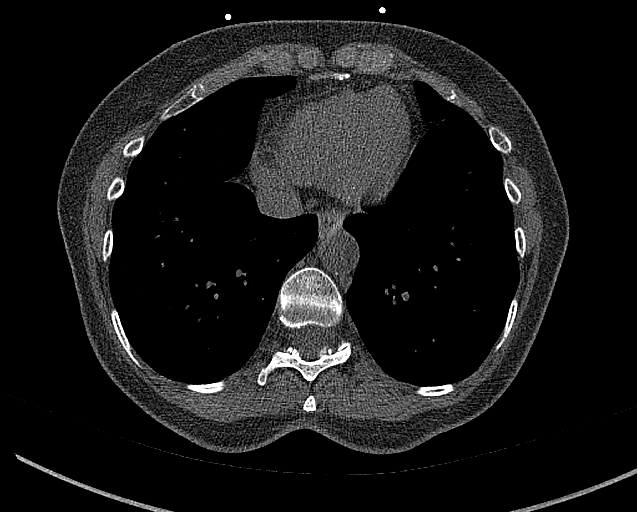
[im 27/80  vessel]
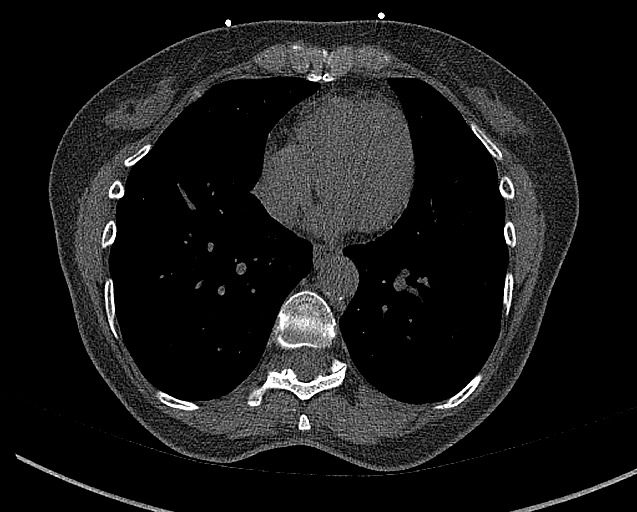
[im 40/80  vessel]
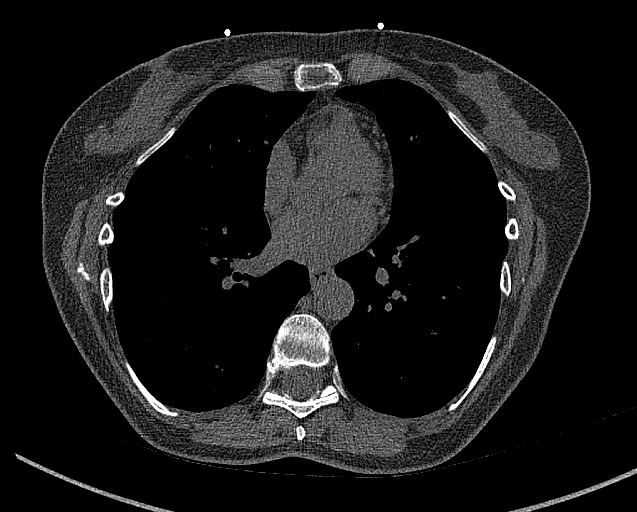
[im 53/80  vessel]
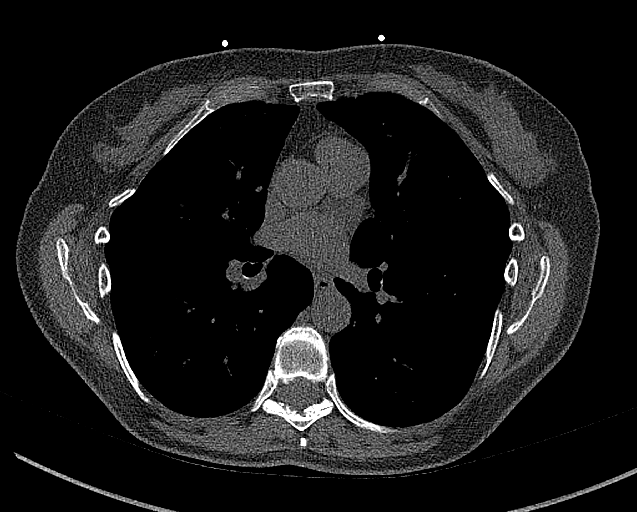
[im 66/80  vessel]
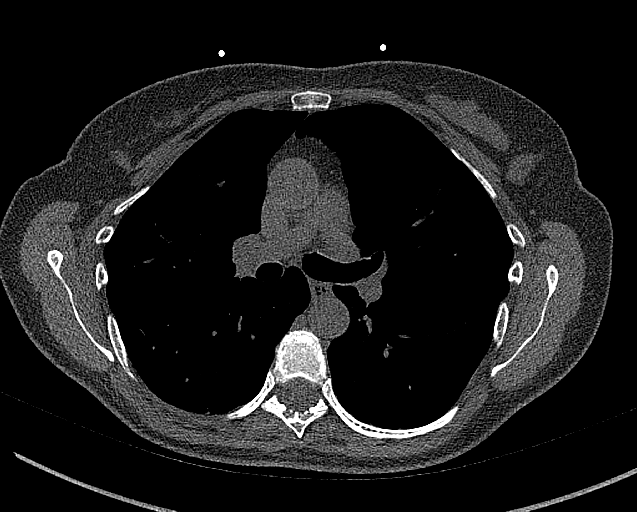

[14 of 20 positions shown; findings below may reference images not displayed]

FINDINGS: CORONARY CALCIUM SCORES:

Left Main: 0

LAD: 0

LCx: 0

RCA: 0

Total Agatston Score: 0

[HOSPITAL] percentile: Zeroth

AORTA MEASUREMENTS:

Ascending Aorta: 25 mm

Descending Aorta: 19 mm

OTHER FINDINGS:

Cardiovascular: The heart is normal in size. Scattered
atherosclerotic calcifications, most prominent about the aortic
arch.

Mediastinum/Nodes: Visualized esophagus and trachea within normal
limits. No mediastinal lymphadenopathy.

Lungs/Pleura: The lungs are clear bilaterally.

Upper Abdomen: The visualized upper abdomen is within normal limits.

Musculoskeletal: No acute osseous abnormality.
IMPRESSION: 1. No evidence of coronary atherosclerotic calcifications. The
observed calcium score of 0 is at the zeroth percentile for subjects
of the same age, gender, and race/ethnicity who are free of clinical
cardiovascular disease and treated diabetes.
2.  Aortic Atherosclerosis (SG0JE-[id number].E).

## 2024-03-25 ENCOUNTER — Inpatient Hospital Stay: Attending: Hematology

## 2024-03-25 DIAGNOSIS — Z1589 Genetic susceptibility to other disease: Secondary | ICD-10-CM

## 2024-03-25 DIAGNOSIS — D75839 Thrombocytosis, unspecified: Secondary | ICD-10-CM | POA: Diagnosis not present

## 2024-03-25 DIAGNOSIS — D72829 Elevated white blood cell count, unspecified: Secondary | ICD-10-CM

## 2024-03-25 DIAGNOSIS — D72828 Other elevated white blood cell count: Secondary | ICD-10-CM | POA: Diagnosis not present

## 2024-03-25 LAB — COMPREHENSIVE METABOLIC PANEL WITH GFR
ALT: 11 U/L (ref 0–44)
AST: 23 U/L (ref 15–41)
Albumin: 3.7 g/dL (ref 3.5–5.0)
Alkaline Phosphatase: 54 U/L (ref 38–126)
Anion gap: 10 (ref 5–15)
BUN: 9 mg/dL (ref 8–23)
CO2: 26 mmol/L (ref 22–32)
Calcium: 9.4 mg/dL (ref 8.9–10.3)
Chloride: 102 mmol/L (ref 98–111)
Creatinine, Ser: 0.7 mg/dL (ref 0.44–1.00)
GFR, Estimated: >60 mL/min (ref >60)
Glucose, Bld: 113 mg/dL — ABNORMAL HIGH (ref 70–99)
Potassium: 3.8 mmol/L (ref 3.5–5.1)
Sodium: 138 mmol/L (ref 135–145)
Total Bilirubin: 1 mg/dL (ref 0.0–1.2)
Total Protein: 6.7 g/dL (ref 6.5–8.1)

## 2024-03-25 LAB — CBC WITH DIFFERENTIAL/PLATELET
Abs Immature Granulocytes: 0 K/uL (ref 0.00–0.07)
Basophils Absolute: 0 K/uL (ref 0.0–0.1)
Basophils Relative: 0 %
Eosinophils Absolute: 0 K/uL (ref 0.0–0.5)
Eosinophils Relative: 0 %
HCT: 43.1 % (ref 36.0–46.0)
Hemoglobin: 14.3 g/dL (ref 12.0–15.0)
Lymphocytes Relative: 16 %
Lymphs Abs: 2.1 K/uL (ref 0.7–4.0)
MCH: 30.6 pg (ref 26.0–34.0)
MCHC: 33.2 g/dL (ref 30.0–36.0)
MCV: 92.3 fL (ref 80.0–100.0)
Monocytes Absolute: 1.3 K/uL — ABNORMAL HIGH (ref 0.1–1.0)
Monocytes Relative: 10 %
Neutro Abs: 9.6 K/uL — ABNORMAL HIGH (ref 1.7–7.7)
Neutrophils Relative %: 74 %
Platelets: 355 K/uL (ref 150–400)
RBC: 4.67 MIL/uL (ref 3.87–5.11)
RDW: 13.2 % (ref 11.5–15.5)
WBC: 13 K/uL — ABNORMAL HIGH (ref 4.0–10.5)
nRBC: 0 % (ref 0.0–0.2)

## 2024-03-25 LAB — LACTATE DEHYDROGENASE: LDH: 139 U/L (ref 98–192)

## 2024-03-26 ENCOUNTER — Ambulatory Visit: Attending: Cardiology | Admitting: Cardiology

## 2024-03-26 ENCOUNTER — Encounter: Payer: Self-pay | Admitting: Cardiology

## 2024-03-26 VITALS — BP 160/80 | HR 74 | Ht 65.0 in | Wt 119.0 lb

## 2024-03-26 DIAGNOSIS — R030 Elevated blood-pressure reading, without diagnosis of hypertension: Secondary | ICD-10-CM

## 2024-03-26 DIAGNOSIS — I6523 Occlusion and stenosis of bilateral carotid arteries: Secondary | ICD-10-CM

## 2024-03-26 DIAGNOSIS — Z8679 Personal history of other diseases of the circulatory system: Secondary | ICD-10-CM

## 2024-03-26 DIAGNOSIS — R931 Abnormal findings on diagnostic imaging of heart and coronary circulation: Secondary | ICD-10-CM | POA: Diagnosis not present

## 2024-03-26 NOTE — Progress Notes (Signed)
    Cardiology Office Note  Date: 03/26/2024   ID: Ray, Glacken [birth year], MRN [id number]  History of Present Illness: Allison Koch is a 68 y.o. female last seen in April 2024.  She is here for a routine visit.  She does not report any palpitations or unusual shortness of breath with activity.  She has been walking on a local trail recently both for exercise and doing nature photography.  We went over her medications.  I also reviewed her lab work.  Her blood pressure is elevated today, she states that it is typically elevated at healthcare visits, she has not been checking it at home.  We went over the results of her carotid Dopplers from last year.  Our plan is to get a fasting lipid profile and discuss treatment options if necessary.  She does have a history of statin myalgias.  I reviewed her ECG today which shows normal sinus rhythm with leftward axis.  Physical Exam: VS:  BP (!) 160/80 (BP Location: Left Arm, Patient Position: Sitting, Cuff Size: Normal)   Pulse 74   Ht 5' 5 (1.651 m)   Wt 119 lb (54 kg)   BMI 19.80 kg/m , BMI Body mass index is 19.8 kg/m.  Wt Readings from Last 3 Encounters:  03/26/24 119 lb (54 kg)  11/18/23 117 lb (53.1 kg)  06/20/23 116 lb 12.8 oz (53 kg)    General: Patient appears comfortable at rest. HEENT: Conjunctiva and lids normal. Neck: Supple, no elevated JVP, soft carotid bruits Lungs: Clear to auscultation, nonlabored breathing at rest. Cardiac: Regular rate and rhythm, no S3 or significant systolic murmur. Extremities: No pitting edema.  ECG:  An ECG dated 01/10/2023 was personally reviewed today and demonstrated:  Sinus rhythm with nonspecific ST changes.  Labwork: 03/25/2024: ALT 11; AST 23; BUN 9; Creatinine, Ser 0.70; Hemoglobin 14.3; Platelets 355; Potassium 3.8; Sodium 138   Other Studies Reviewed Today:  No interval cardiac testing for review today.  Assessment and Plan:  1.  History of atrial fibrillation in setting of  acute illness without obvious recurrence.  Will continue observation over time.  She remains asymptomatic and her follow-up ECG shows normal sinus rhythm.   2.  Bilateral carotid artery bifurcation atherosclerosis, less than 50% by carotid Dopplers in May 2024.  She is asymptomatic.  We discussed rationale for blood pressure control and lipid management in this setting.  Plan to obtain FLP.  She does have a history of statin intolerance, may need to consider bempedoic acid or Zetia potentially.  3.  Coronary calcium  score of 0 as of June 2023.  4.  Elevated blood pressure.  I have asked her to get a new home blood pressure automatic cuff for readings.  Disposition:  Follow up 6 months.  Signed, Jayson JUDITHANN Sierras, M.D., F.A.C.C. [facility]

## 2024-03-26 NOTE — Patient Instructions (Signed)
 Medication Instructions:   Your physician recommends that you continue on your current medications as directed. Please refer to the Current Medication list given to you today.   Labwork: Fasting lipids  Testing/Procedures: None today  Follow-Up: 6 months  Any Other Special Instructions Will Be Listed Below (If Applicable).  If you need a refill on your cardiac medications before your next appointment, please call your pharmacy.

## 2024-03-28 ENCOUNTER — Other Ambulatory Visit: Payer: Self-pay | Admitting: Medical

## 2024-03-28 DIAGNOSIS — E039 Hypothyroidism, unspecified: Secondary | ICD-10-CM

## 2024-03-30 ENCOUNTER — Telehealth: Payer: Self-pay | Admitting: Cardiology

## 2024-03-30 NOTE — Telephone Encounter (Signed)
 Pt is requesting a callback regarding her cholesterol check. Please advise

## 2024-03-30 NOTE — Telephone Encounter (Signed)
 Patient is calling back to confirm appointment scheduled for 04/23/2024

## 2024-03-30 NOTE — Telephone Encounter (Signed)
 Patient will have lipids done in August at PCP apt.

## 2024-03-30 NOTE — Telephone Encounter (Signed)
 Patient is due for a visit. Please schedule. Once scheduled I can refill medication

## 2024-03-30 NOTE — Telephone Encounter (Signed)
 Pt scheduled cpe for 04/18/24

## 2024-03-31 NOTE — Progress Notes (Unsigned)
 [facility] [address] [place], [place] [address]   CLINIC:  Medical Oncology/Hematology  PCP:  Allison Koch [address] [place] [address] [phone]   REASON FOR VISIT:  Follow-up for JAK2+ leukocytosis and hypercalcemia   PRIOR THERAPY: None   CURRENT THERAPY: Surveillance   INTERVAL HISTORY:   Allison Koch 68 y.o. female returns for routine follow-up of JAK2+ leukocytosis and mild hypercalcemia.  She was last seen by Allison Barefoot PA-C on 11/18/2023.  Overall, she is feeling fairly well, apart from stress sorting through her mother's estate. She has not had any infections or interim steroid medications. She has not noticed any new lumps or bumps. No B symptoms such as fevers, chills, night sweats, or unintentional weight loss.   She denies any vasomotor symptoms such as aquagenic pruritus, Raynaud's phenomenon, erythromelalgia, dizziness, transient vision changes, or new onset peripheral neuropathy.    No prior history of DVT or PE, and no current symptoms concerning for DVT or PE.   She is taking Caltrate supplement once daily (instead of twice daily).    She has 100% energy and 100% appetite. She endorses that she is maintaining a stable weight.  ASSESSMENT & PLAN:  1.  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 -She denies any history of splenectomy.  No systemic steroids.  No history of connective tissue disorders. 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 Normal LDH, normal reticulocytes.  Rheumatoid factor and ANA negative.  Normal ESR and CRP. - WBCs persistently elevated over the last four months:  (11/12/2023): WBC 13.2/ANC 10.1/monocytes 1.2.  Platelets 422 (12/19/2023): WBC 10.8, normal platelets, normal  differential - Most recent CBC (03/25/2024): WBC 13.0/ANC 9.6/monocytes 1.3.  Normal platelets.  Grossly normal CMP.  Normal LDH.  (Uric acid normal as of February 2025) - She has been taking aspirin  81 mg daily ever since initial elevation in platelets was noted in February 2025 - No palpable splenomegaly or lymphadenopathy on exam.   - No vasomotor symptoms or B symptoms. - Denies any recent infections or steroids  - We have discussed that she may have very early signs of MPN, with associated risk of thromboembolic events, myelofibrosis, or leukemic transformation. - PLAN: Intermittent leukocytosis overall stable.  Mild intermittent thrombocytosis over the past 6 months. - We will hold off on cytoreductive therapy (Hydrea) at this time, but will check CBC/D every 2 months - Continue aspirin  81 mg daily (intermittently elevated platelets) - Labs (CBC/D, CMP, LDH, uric acid) + RTC in 6 months  - We will start Hydrea if platelets persistently >400, hematocrit >45%, or WBC >20   2.  Mild hypercalcemia, RESOLVED - Previously had mild hypercalcemia related to taking Caltrate twice daily - Other workup for hypercalcemia was negative - Hypercalcemia resolved after Caltrate reduced to once daily   3.  Other history - Lives at home with husband.  Retired from office type work.  No chemical exposure.  Non-smoker. - Mother had colon cancer.  Paternal aunt had cancer in her foot.  Maternal aunt had brain cancer.  PLAN SUMMARY:  >> CBC/D every 2 months >> Labs in 6 months = CBC/D, CMP, LDH, uric acid >> OFFICE visit in 6 months (after labs)    REVIEW OF SYSTEMS:   Review of Systems  Constitutional:  Negative for  appetite change, chills, diaphoresis, fatigue, fever and unexpected weight change.  HENT:   Negative for lump/mass and nosebleeds.   Eyes:  Negative for eye problems.  Respiratory:  Negative for cough, hemoptysis and shortness of breath.   Cardiovascular:  Negative for chest pain, leg  swelling and palpitations.  Gastrointestinal:  Negative for abdominal pain, blood in stool, constipation, diarrhea, nausea and vomiting.  Genitourinary:  Negative for hematuria.   Skin: Negative.   Neurological:  Negative for dizziness, headaches and light-headedness.  Hematological:  Does not bruise/bleed easily.     PHYSICAL EXAM:  ECOG PERFORMANCE STATUS: 0 - Asymptomatic  Vitals:   04/01/24 1335 04/01/24 1340  BP: (!) 182/55 (!) 147/60  Pulse: 69   Resp: 18   Temp: 98.7 F (37.1 C)   SpO2: 98%     Filed Weights   04/01/24 1335  Weight: 118 lb 2.7 oz (53.6 kg)    Physical Exam Constitutional:      Appearance: Normal appearance.  HENT:     Head: Normocephalic and atraumatic.     Mouth/Throat:     Mouth: Mucous membranes are moist.  Eyes:     Extraocular Movements: Extraocular movements intact.     Pupils: Pupils are equal, round, and reactive to light.  Cardiovascular:     Rate and Rhythm: Normal rate and regular rhythm.     Pulses: Normal pulses.     Heart sounds: Normal heart sounds.  Pulmonary:     Effort: Pulmonary effort is normal.     Breath sounds: Normal breath sounds.  Abdominal:     General: Bowel sounds are normal.     Palpations: Abdomen is soft. There is no splenomegaly.     Tenderness: There is no abdominal tenderness.  Musculoskeletal:        General: No swelling.     Right lower leg: No edema.     Left lower leg: No edema.  Lymphadenopathy:     Head:     Right side of head: No submental, submandibular, tonsillar, preauricular, posterior auricular or occipital adenopathy.     Left side of head: No submental, submandibular, tonsillar, preauricular, posterior auricular or occipital adenopathy.     Cervical: No cervical adenopathy.     Right cervical: No superficial, deep or posterior cervical adenopathy.    Left cervical: No superficial, deep or posterior cervical adenopathy.     Upper Body:     Right upper body: No supraclavicular adenopathy.      Left upper body: No supraclavicular adenopathy.  Skin:    General: Skin is warm and dry.  Neurological:     General: No focal deficit present.     Mental Status: She is alert and oriented to person, place, and time.  Psychiatric:        Mood and Affect: Mood normal.        Behavior: Behavior normal.     PAST MEDICAL/SURGICAL HISTORY:  Past Medical History:  Diagnosis Date   Hypothyroidism    PAF (paroxysmal atrial fibrillation) (HCC)    Noted in setting of acute illness   Tubular adenoma 07/2019   colonoscopy, Dr. Gordy Starch   Past Surgical History:  Procedure Laterality Date   COLONOSCOPY  07/2019   Dr. Gordy Starch, tubular adenoma, repeat 5 years   EYE SURGERY     TONSILLECTOMY     UTERINE FIBROID SURGERY     WISDOM TOOTH EXTRACTION      SOCIAL HISTORY:  Social History  Socioeconomic History   Marital status: Married    Spouse name: Not on file   Number of children: Not on file   Years of education: Not on file   Highest education level: Not on file  Occupational History   Not on file  Tobacco Use   Smoking status: Never   Smokeless tobacco: Never  Vaping Use   Vaping status: Never Used  Substance and Sexual Activity   Alcohol use: No   Drug use: No   Sexual activity: Not on file  Other Topics Concern   Not on file  Social History Narrative   Advertising account executive 4H camp, [place]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   Social Drivers of Health   Financial Resource Strain: Low Risk  (01/28/2024)   Overall Financial Resource Strain (CARDIA)    Difficulty of Paying Living Expenses: Not hard at all  Food Insecurity: No Food Insecurity (01/28/2024)   Hunger Vital Sign    Worried About Running Out of Food in the Last Year: Never true    Ran Out of Food in the Last Year: Never true  Transportation Needs: No Transportation Needs (01/28/2024)   PRAPARE - Administrator, Civil Service (Medical): No    Lack of Transportation (Non-Medical): No  Physical Activity:  Sufficiently Active (01/28/2024)   Exercise Vital Sign    Days of Exercise per Week: 4 days    Minutes of Exercise per Session: 60 min  Stress: No Stress Concern Present (01/28/2024)   [employer] of Occupational Health - Occupational Stress Questionnaire    Feeling of Stress : Not at all  Social Connections: Socially Integrated (01/28/2024)   Social Connection and Isolation Panel    Frequency of Communication with Friends and Family: Twice a week    Frequency of Social Gatherings with Friends and Family: Twice a week    Attends Religious Services: More than 4 times per year    Active Member of [employer] or Organizations: Yes    Attends Engineer, structural: More than 4 times per year    Marital Status: Married  Catering manager Violence: Not At Risk (01/28/2024)   Humiliation, Afraid, Rape, and Kick questionnaire    Fear of Current or Ex-Partner: No    Emotionally Abused: No    Physically Abused: No    Sexually Abused: No    FAMILY HISTORY:  Family History  Problem Relation Age of Onset   Arrhythmia Mother        atrial fib   Colon cancer Mother 93   Dementia Mother    COPD Mother    Cancer Mother    Heart attack Father        CABG x9, maybe in 13s   Heart failure Father        died of CHF   Diabetes Father    Esophageal cancer Neg Hx    Rectal cancer Neg Hx    Stomach cancer Neg Hx    Heart disease Neg Hx     CURRENT MEDICATIONS:  Outpatient Encounter Medications as of 04/01/2024  Medication Sig Note   aspirin  EC 81 MG tablet Take 1 tablet (81 mg total) by mouth daily. Swallow whole.    Calcium  Carbonate-Vitamin D  600-400 MG-UNIT per chew tablet Chew 1 tablet by mouth daily.    estradiol  (VIVELLE -DOT) 0.05 MG/24HR patch estradiol  0.05 mg/24 hr semiweekly transdermal patch  APPLY ONE PATCH ON THE SKIN TWICE A WEEK 01/17/2022: Changed mg 9.05 mg patch   levothyroxine  (SYNTHROID ) 75  MCG tablet TAKE 1 TABLET BY MOUTH EVERY DAY BEFORE BREAKFAST    progesterone  (PROMETRIUM) 100 MG capsule Take 100 mg by mouth at bedtime.    No facility-administered encounter medications on file as of 04/01/2024.    ALLERGIES:  Allergies  Allergen Reactions   Other     Mycins per patient.   Penicillins    Statins     Myalgias and arthralgias   Sulfa Antibiotics Other (See Comments)    LABORATORY DATA:  I have reviewed the labs as listed.  CBC    Component Value Date/Time   WBC 13.0 (H) 03/25/2024 1402   RBC 4.67 03/25/2024 1402   HGB 14.3 03/25/2024 1402   HGB 14.1 02/12/2023 1427   HCT 43.1 03/25/2024 1402   HCT 42.8 02/12/2023 1427   PLT 355 03/25/2024 1402   PLT 425 02/12/2023 1427   MCV 92.3 03/25/2024 1402   MCV 89 02/12/2023 1427   MCH 30.6 03/25/2024 1402   MCHC 33.2 03/25/2024 1402   RDW 13.2 03/25/2024 1402   RDW 12.8 02/12/2023 1427   LYMPHSABS 2.1 03/25/2024 1402   LYMPHSABS 2.0 02/12/2023 1427   MONOABS 1.3 (H) 03/25/2024 1402   EOSABS 0.0 03/25/2024 1402   EOSABS 0.1 02/12/2023 1427   BASOSABS 0.0 03/25/2024 1402   BASOSABS 0.1 02/12/2023 1427      Latest Ref Rng & Units 03/25/2024    2:02 PM 11/12/2023   10:23 AM 05/16/2023   10:54 AM  CMP  Glucose 70 - 99 mg/dL 886  96  92   BUN 8 - 23 mg/dL 9  11  10    Creatinine 0.44 - 1.00 mg/dL 9.29  9.27  9.19   Sodium 135 - 145 mmol/L 138  138  136   Potassium 3.5 - 5.1 mmol/L 3.8  3.3  3.4   Chloride 98 - 111 mmol/L 102  105  104   CO2 22 - 32 mmol/L 26  24  26    Calcium  8.9 - 10.3 mg/dL 9.4  9.4  9.2   Total Protein 6.5 - 8.1 g/dL 6.7  6.7  6.3   Total Bilirubin 0.0 - 1.2 mg/dL 1.0  1.4  1.0   Alkaline Phos 38 - 126 U/L 54  49  47   AST 15 - 41 U/L 23  22  26    ALT 0 - 44 U/L 11  10  16      DIAGNOSTIC IMAGING:  I have independently reviewed the relevant imaging and discussed with the patient.   WRAP UP:  All questions were answered. The patient knows to call the clinic with any problems, questions or concerns.  Medical decision making: Moderate  Time spent on visit: I  spent 20 minutes counseling the patient face to face. The total time spent in the appointment was 30 minutes and more than 50% was on counseling.  Allison CHRISTELLA Barefoot, PA-C  04/01/24 2:47 PM

## 2024-04-01 ENCOUNTER — Inpatient Hospital Stay: Admitting: Physician Assistant

## 2024-04-01 VITALS — BP 147/60 | HR 69 | Temp 98.7°F | Resp 18 | Wt 118.2 lb

## 2024-04-01 DIAGNOSIS — Z1589 Genetic susceptibility to other disease: Secondary | ICD-10-CM

## 2024-04-01 DIAGNOSIS — D72829 Elevated white blood cell count, unspecified: Secondary | ICD-10-CM

## 2024-04-01 DIAGNOSIS — D72828 Other elevated white blood cell count: Secondary | ICD-10-CM | POA: Diagnosis not present

## 2024-04-01 DIAGNOSIS — D75839 Thrombocytosis, unspecified: Secondary | ICD-10-CM | POA: Diagnosis not present

## 2024-04-01 NOTE — Patient Instructions (Signed)
 [facility] at [facility] **VISIT SUMMARY & IMPORTANT INSTRUCTIONS **   You were seen today by Pleasant Barefoot PA-C for your elevated white blood cells.    Elevated white blood cells & platelets Your white blood cells are mildly elevated, likely related to your JAK2 mutation. We will check your blood every 2 months. Platelets are currently normal, but since they have been intermittently elevated , I would like you to continue taking aspirin  81 mg daily to decrease your risk of blood clot, heart attack, and strokes.  Elevated calcium  Your calcium  level has come back to normal. You can continue to take your Caltrate supplement, but should only take it ONCE daily.  FOLLOW-UP APPOINTMENT: Office visit in 6 months  ** Thank you for trusting me with your healthcare!  I strive to provide all of my patients with quality care at each visit.  If you receive a survey for this visit, I would be so grateful to you for taking the time to provide feedback.  Thank you in advance!  ~ Mikaela Hilgeman                   Dr. Alean Stands   &   Pleasant Barefoot, PA-C   - - - - - - - - - - - - - - - - - -    Thank you for choosing [facility] at [facility] to provide your oncology and hematology care.  To afford each patient quality time with our provider, please arrive at least 15 minutes before your scheduled appointment time.   If you have a lab appointment with the Cancer Center please come in thru the Main Entrance and check in at the main information desk.  You need to re-schedule your appointment should you arrive 10 or more minutes late.  We strive to give you quality time with our providers, and arriving late affects you and other patients whose appointments are after yours.  Also, if you no show three or more times for appointments you may be dismissed from the clinic at the providers discretion.     Again, thank you for choosing [facility]  Our hope is that these requests will decrease the amount of time that you wait before being seen by our physicians.       _____________________________________________________________  Should you have questions after your visit to [facility], please contact our office at [phone] and follow the prompts.  Our office hours are 8:00 a.m. and 4:30 p.m. Monday - Friday.  Please note that voicemails left after 4:00 p.m. may not be returned until the following business day.  We are closed weekends and major holidays.  You do have access to a nurse 24-7, just call the main number to the clinic [phone] and do not press any options, hold on the line and a nurse will answer the phone.    For prescription refill requests, have your pharmacy contact our office and allow 72 hours.

## 2024-04-06 DIAGNOSIS — H2513 Age-related nuclear cataract, bilateral: Secondary | ICD-10-CM | POA: Diagnosis not present

## 2024-04-06 DIAGNOSIS — H43812 Vitreous degeneration, left eye: Secondary | ICD-10-CM | POA: Diagnosis not present

## 2024-04-06 DIAGNOSIS — H59811 Chorioretinal scars after surgery for detachment, right eye: Secondary | ICD-10-CM | POA: Diagnosis not present

## 2024-04-06 DIAGNOSIS — H43392 Other vitreous opacities, left eye: Secondary | ICD-10-CM | POA: Diagnosis not present

## 2024-04-06 DIAGNOSIS — H33102 Unspecified retinoschisis, left eye: Secondary | ICD-10-CM | POA: Diagnosis not present

## 2024-04-06 DIAGNOSIS — H4312 Vitreous hemorrhage, left eye: Secondary | ICD-10-CM | POA: Diagnosis not present

## 2024-04-23 ENCOUNTER — Encounter: Payer: Self-pay | Admitting: Medical

## 2024-04-23 ENCOUNTER — Ambulatory Visit: Admitting: Medical

## 2024-04-23 VITALS — BP 130/70 | HR 64 | Ht 64.5 in | Wt 117.6 lb

## 2024-04-23 DIAGNOSIS — R413 Other amnesia: Secondary | ICD-10-CM | POA: Diagnosis not present

## 2024-04-23 DIAGNOSIS — T466X5A Adverse effect of antihyperlipidemic and antiarteriosclerotic drugs, initial encounter: Secondary | ICD-10-CM | POA: Diagnosis not present

## 2024-04-23 DIAGNOSIS — E039 Hypothyroidism, unspecified: Secondary | ICD-10-CM

## 2024-04-23 DIAGNOSIS — E785 Hyperlipidemia, unspecified: Secondary | ICD-10-CM

## 2024-04-23 DIAGNOSIS — Z1589 Genetic susceptibility to other disease: Secondary | ICD-10-CM

## 2024-04-23 DIAGNOSIS — Z1211 Encounter for screening for malignant neoplasm of colon: Secondary | ICD-10-CM | POA: Diagnosis not present

## 2024-04-23 DIAGNOSIS — R7301 Impaired fasting glucose: Secondary | ICD-10-CM | POA: Diagnosis not present

## 2024-04-23 DIAGNOSIS — M609 Myositis, unspecified: Secondary | ICD-10-CM | POA: Diagnosis not present

## 2024-04-23 DIAGNOSIS — Z0000 Encounter for general adult medical examination without abnormal findings: Secondary | ICD-10-CM

## 2024-04-23 NOTE — Progress Notes (Addendum)
 Subjective:   HPI  Allison Koch is a 68 y.o. female who presents for Chief Complaint  Patient presents with   Annual Exam    Fasting cpe, no concerns other than feels like her brain is in a fog    Patient Care Team: Shaylinn Hladik, Alm RAMAN, PA-C as PCP - General (Family Medicine) Debera Jayson MATSU, MD as PCP - Cardiology (Cardiology) Rogers Hai, MD as Medical Oncologist (Hematology) Johnnye Ade, MD as Consulting Physician (Obstetrics and Gynecology) Dr. Gordy Starch, GI    Concerns: She notes back in April the Thursday before easter, had sudden flashes of light, floaters.  Saw eye doctor, there was initial concern for retinal hemorrhage.   However, after she did follow up with retinal specialist they saw no hemorrhoid.    At this point no more flashes of light but still has 1 big floater.    Lately she feels brain fog, not feeling as alert as she should.  She does have some stress with getting her mother's house ready to sell since she passed away.    She stays active with exercise, photography, likes being outdoors.   Is concerned about memory though.   Reviewed their medical, surgical, family, social, medication, and allergy history and updated chart as appropriate.  Allergies  Allergen Reactions   Other     Mycins per patient.   Penicillins    Statins     Myalgias and arthralgias   Sulfa Antibiotics Other (See Comments)    Past Medical History:  Diagnosis Date   Hypothyroidism    PAF (paroxysmal atrial fibrillation) (HCC)    Noted in setting of acute illness   Tubular adenoma 07/2019   colonoscopy, Dr. Gordy Starch      Current Outpatient Medications:    aspirin  EC 81 MG tablet, Take 1 tablet (81 mg total) by mouth daily. Swallow whole., Disp: 30 tablet, Rfl: 12   Calcium  Carbonate-Vitamin D  600-400 MG-UNIT per chew tablet, Chew 1 tablet by mouth daily., Disp: , Rfl:    estradiol  (VIVELLE -DOT) 0.05 MG/24HR patch, estradiol  0.05 mg/24 hr semiweekly  transdermal patch  APPLY ONE PATCH ON THE SKIN TWICE A WEEK, Disp: , Rfl:    levothyroxine  (SYNTHROID ) 75 MCG tablet, TAKE 1 TABLET BY MOUTH EVERY DAY BEFORE BREAKFAST, Disp: 90 tablet, Rfl: 0   progesterone (PROMETRIUM) 100 MG capsule, Take 100 mg by mouth at bedtime., Disp: , Rfl:   Family History  Problem Relation Age of Onset   Arrhythmia Mother        atrial fib   Colon cancer Mother 49   Dementia Mother    COPD Mother    Cancer Mother    Heart attack Father        CABG x9, maybe in 48s   Heart failure Father        died of CHF   Diabetes Father    Esophageal cancer Neg Hx    Rectal cancer Neg Hx    Stomach cancer Neg Hx    Heart disease Neg Hx     Past Surgical History:  Procedure Laterality Date   COLONOSCOPY  07/2019   Dr. Gordy Pyrtle, tubular adenoma, repeat 5 years   EYE SURGERY     TONSILLECTOMY     UTERINE FIBROID SURGERY     WISDOM TOOTH EXTRACTION        Review of Systems  Constitutional:  Negative for chills, fever, malaise/fatigue and weight loss.  HENT:  Negative for congestion, ear pain,  hearing loss, sore throat and tinnitus.   Eyes:  Negative for blurred vision, pain and redness.  Respiratory:  Negative for cough, hemoptysis and shortness of breath.   Cardiovascular:  Negative for chest pain, palpitations, orthopnea, claudication and leg swelling.  Gastrointestinal:  Negative for abdominal pain, blood in stool, constipation, diarrhea, nausea and vomiting.  Genitourinary:  Negative for dysuria, flank pain, frequency, hematuria and urgency.  Musculoskeletal:  Negative for falls, joint pain and myalgias.  Skin:  Negative for itching and rash.  Neurological:  Positive for sensory change. Negative for dizziness, tingling, speech change, weakness and headaches.  Endo/Heme/Allergies:  Negative for polydipsia. Does not bruise/bleed easily.  Psychiatric/Behavioral:  Positive for memory loss. Negative for depression. The patient is not nervous/anxious and does  not have insomnia.         Objective:  BP 130/70 Comment: our cuff  Pulse 64   Ht 5' 4.5 (1.638 m)   Wt 117 lb 9.6 oz (53.3 kg)   SpO2 99%   BMI 19.87 kg/m   Wt Readings from Last 3 Encounters:  04/23/24 117 lb 9.6 oz (53.3 kg)  04/01/24 118 lb 2.7 oz (53.6 kg)  03/26/24 119 lb (54 kg)    BP Readings from Last 3 Encounters:  04/23/24 130/70  04/01/24 (!) 147/60  03/26/24 (!) 160/80    General appearance: alert, no distress, WD/WN, Caucasian female Skin: unremarkable HEENT: normocephalic, conjunctiva/corneas normal, sclerae anicteric, PERRLA, EOMi, nares patent, no discharge or erythema, pharynx normal Oral cavity: MMM, tongue normal, teeth normal Neck: supple, no lymphadenopathy, no thyromegaly, no masses, normal ROM, no bruits Chest: non tender, normal shape and expansion Heart: RRR, normal S1, S2, no murmurs Lungs: CTA bilaterally, no wheezes, rhonchi, or rales Abdomen: +bs, soft, non tender, non distended, no masses, no hepatomegaly, no splenomegaly, no bruits Back: non tender, normal ROM, no scoliosis Musculoskeletal: upper extremities non tender, no obvious deformity, normal ROM throughout, lower extremities non tender, no obvious deformity, normal ROM throughout Extremities: no edema, no cyanosis, no clubbing Pulses: 2+ symmetric, upper and lower extremities, normal cap refill Neurological: alert, oriented x 3, CN2-12 intact, strength normal upper extremities and lower extremities, sensation normal throughout, DTRs 2+ throughout, no cerebellar signs, gait normal Psychiatric: normal affect, behavior normal, pleasant  Breast/gyn/rectal - deferred to gynecology     Assessment and Plan :   Encounter Diagnoses  Name Primary?   Encounter for health maintenance examination in adult Yes   Screening for colon cancer    Hypothyroidism, unspecified type    Memory change    Impaired fasting glucose    Hyperlipidemia, unspecified hyperlipidemia type    Statin-induced  myositis    JAK-2 gene mutation      This visit was a preventative care visit, also known as wellness visit or routine physical.   Topics typically include healthy lifestyle, diet, exercise, preventative care, vaccinations, sick and well care, proper use of emergency dept and after hours care, as well as other concerns.    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  I reviewed her July 2025 office notes.  No treatment with Hydrea cytoreductive therapy at this time.  They will continue to monitor labs.  If platelets persistently over 400, hematocrit over 45%, or white cells greater than 20,000, then Hydrea will be implemented.  She was continued on aspirin  81 mg daily  I reviewed recent blood work she had done.  [facility] metabolic panel from 03/25/2024 shows blood sugar 113, otherwise  liver kidney electrolytes normal.  CBC 03/25/2024 shows white cells elevated at 13,000, monocytes and neutrophils little elevated.  LDH normal  Memory change - labs today, MMSE reviewed.  Consider brain MRI and/or neurology consult.  MMSE normal.  Hyperlipidemia - declines prescription cholesterol medication.  She may try fish oil OTC for lipids and memory    General Recommendations: Continue to return yearly for your annual wellness and preventative care visits.  This gives us  a chance to discuss healthy lifestyle, exercise, vaccinations, review your chart record, and perform screenings where appropriate.  I recommend you see your eye doctor yearly for routine vision care.  I recommend you see your dentist yearly for routine dental care including hygiene visits twice yearly.  See your gynecologist yearly for routine gynecological care.    Vaccination recommendations were reviewed Immunization History  Administered Date(s) Administered   PFIZER(Purple Top)SARS-COV-2 Vaccination 03/24/2020, 04/15/2020    Vaccine recommendations: Tdap, Shingrix,  pneumococcal, yearly flu shot  Vaccines administered today: None, declines for now   Screening for cancer: Colon cancer screening: November 2020 colonoscopy reviewed, multiple polyps seen.  Due repeat this year 2025.    Breast cancer screening: You should perform a self breast exam monthly.   We reviewed recommendations for regular mammograms and breast cancer screening. We will request most recent mammogram from gyn  Cervical cancer screening: We reviewed recommendations for pap smear screening. Sees gyn, > 65yo beyond typical screening age.   Skin cancer screening: Check your skin regularly for new changes, growing lesions, or other lesions of concern Come in for evaluation if you have skin lesions of concern.  Lung cancer screening: If you have a greater than 20 pack year history of tobacco use, then you may qualify for lung cancer screening with a chest CT scan.   Please call your insurance company to inquire about coverage for this test.  Pancreatic cancer: no current screening test is available routinely recommended.  (Risk factors: Smoking, overweight or obese, diabetes, chronic pancreatitis, work Nurse, mental health, Solicitor, 70 year old or greater, female greater than female, African-American, family history of pancreatic cancer, hereditary breast, ovarian, melanoma, Lynch, Peutz-jeghers).  We currently don't have screenings for other cancers besides breast, cervical, colon, and lung cancers.  If you have a strong family history of cancer or have other cancer screening concerns, please let me know.    Bone health: Get at least 150 minutes of aerobic exercise weekly Get weight bearing exercise at least once weekly Bone density test:  A bone density test is an imaging test that uses a type of X-ray to measure the amount of calcium  and other minerals in your bones. The test may be used to diagnose or screen you for a condition that causes weak or thin bones  (osteoporosis), predict your risk for a broken bone (fracture), or determine how well your osteoporosis treatment is working. The bone density test is recommended for females 65 and older, or females or males <65 if certain risk factors such as thyroid  disease, long term use of steroids such as for asthma or rheumatological issues, vitamin D  deficiency, estrogen deficiency, family history of osteoporosis, self or family history of fragility fracture in first degree relative.  05/2022 bone density test shows osteopenia, plans this with gynecology this fall.   Heart health: Get at least 150 minutes of aerobic exercise weekly Limit alcohol It is important to maintain a healthy blood pressure and healthy cholesterol numbers  Heart disease screening: Screening for heart disease includes screening for blood  pressure, fasting lipids, glucose/diabetes screening, BMI height to weight ratio, reviewed of smoking status, physical activity, and diet.    Goals include blood pressure 120/80 or less, maintaining a healthy lipid/cholesterol profile, preventing diabetes or keeping diabetes numbers under good control, not smoking or using tobacco products, exercising most days per week or at least 150 minutes per week of exercise, and eating healthy variety of fruits and vegetables, healthy oils, and avoiding unhealthy food choices like fried food, fast food, high sugar and high cholesterol foods.    Other tests may possibly include EKG test, CT coronary calcium  score, echocardiogram, exercise treadmill stress test.    Heart disease testing completed: CT coronary calcium  test 02/19/2022 shows aortic atherosclerosis, coronary score 0  Carotid ultrasound 02/04/2023 shows less than 50% atherosclerosis, there is bilateral carotid bifurcation plaque    Vascular disease screening: For high risk individuals including smokers, diabetes, patients with known heart disease or high blood pressure, kidney disease, and  others, screening for vascular disease or atherosclerosis of the arteries is available.  Examples may include carotid ultrasound, abdominal aortic ultrasound, ABI blood flow screening in the legs, thoracic aorta screening.   Medical care options: I recommend you continue to seek care here first for routine care.  We try really hard to have available appointments Monday through Friday daytime hours for sick visits, acute visits, and physicals.  Urgent care should be used for after hours and weekends for significant issues that cannot wait till the next day.  The emergency department should be used for significant potentially life-threatening emergencies.  The emergency department is expensive, can often have long wait times for less significant concerns, so try to utilize primary care, urgent care, or telemedicine when possible to avoid unnecessary trips to the emergency department.  Virtual visits and telemedicine have been introduced since the pandemic started in 2020, and can be convenient ways to receive medical care.  We offer virtual appointments as well to assist you in a variety of options to seek medical care.   Legal Take the time to do a Last Will and Testament, advanced directives including Healthcare Power of Attorney and Living Will documents.  Do not leave your family with burdens that can be handled ahead of time.   Advanced Directives: I recommend you consider completing a Health Care Power of Attorney and Living Will.   These documents respect your wishes and help alleviate burdens on your loved ones if you were to become terminally ill or be in a position to need those documents enforced.    You can complete Advanced Directives yourself, have them notarized, then have copies made for our office, for you and for anybody you feel should have them in safe keeping.  Or, you can have an attorney prepare these documents.   If you haven't updated your Last Will and Testament in a while, it  may be worthwhile having an attorney prepare these documents together and save on some costs.       Spiritual and Emotional Health Keeping a healthy spiritual life can help you better manage your physical health. Your spiritual life can help you to cope with any issues that may arise with your physical health.  Balance can keep us  healthy and help us  to recover.  If you are struggling with your spiritual health there are questions that you may want to ask yourself:  What makes me feel most complete? When do I feel most connected to the rest of the world? Where do  I find the most inner strength? What am I doing when I feel whole?  Helpful tips: Being in nature. Some people feel very connected and at peace when they are walking outdoors or are outside. Helping others. Some feel the largest sense of wellbeing when they are of service to others. Being of service can take on many forms. It can be doing volunteer work, being kind to strangers, or offering a hand to a friend in need. Gratitude. Some people find they feel the most connected when they remain grateful. They may make lists of all the things they are grateful for or say a thank you out loud for all they have.    Emotional Health Are you in tune with your emotional health?  Check out this link: http://www.marquez-love.com/   Financial Health Make sure you use a budget for your personal finances Make sure you are insured against risks (health insurance, life insurance, auto insurance, etc) Save more, spend less Set financial goals If you need help in this area, good resources include counseling through [employer] or other community resources, have a meeting with a Social research officer, government, and a good resource is [employer] Shoulder podcast    Allison Koch was seen today for annual exam.  Diagnoses and all orders for this visit:  Encounter for health maintenance examination in adult -     TSH + free T4 -     Hemoglobin A1c -      Vitamin B12  Screening for colon cancer  Hypothyroidism, unspecified type -     TSH + free T4  Memory change -     Vitamin B12  Impaired fasting glucose -     Hemoglobin A1c  Hyperlipidemia, unspecified hyperlipidemia type  Statin-induced myositis  JAK-2 gene mutation     Follow-up pending labs, yearly for physical

## 2024-04-24 ENCOUNTER — Other Ambulatory Visit: Payer: Self-pay | Admitting: Medical

## 2024-04-24 ENCOUNTER — Ambulatory Visit: Payer: Self-pay | Admitting: Medical

## 2024-04-24 LAB — TSH+FREE T4
Free T4: 1.82 ng/dL — AB (ref 0.82–1.77)
TSH: 1.29 u[IU]/mL (ref 0.450–4.500)

## 2024-04-24 LAB — HEMOGLOBIN A1C
Est. average glucose Bld gHb Est-mCnc: 105 mg/dL
Hgb A1c MFr Bld: 5.3 % (ref 4.8–5.6)

## 2024-04-24 LAB — VITAMIN B12: Vitamin B-12: 400 pg/mL (ref 232–1245)

## 2024-04-24 MED ORDER — LEVOTHYROXINE SODIUM 50 MCG PO TABS: 50.0000 ug | ORAL_TABLET | Freq: Every day | ORAL | 0 refills | Status: DC

## 2024-04-24 NOTE — Progress Notes (Signed)
Results through My Chart

## 2024-04-28 ENCOUNTER — Ambulatory Visit: Payer: Self-pay | Admitting: Medical

## 2024-04-28 NOTE — Progress Notes (Signed)
 abstract

## 2024-05-21 ENCOUNTER — Telehealth: Admitting: Medical

## 2024-05-21 VITALS — Wt 115.0 lb

## 2024-05-21 DIAGNOSIS — J0110 Acute frontal sinusitis, unspecified: Secondary | ICD-10-CM | POA: Diagnosis not present

## 2024-05-21 MED ORDER — CEFUROXIME AXETIL 500 MG PO TABS
500.0000 mg | ORAL_TABLET | Freq: Two times a day (BID) | ORAL | 0 refills | Status: AC
Start: 2024-05-21 — End: 2024-05-31

## 2024-05-21 NOTE — Telephone Encounter (Signed)
 Called pt instead and talked to her to an appt

## 2024-05-21 NOTE — Progress Notes (Signed)
 Subjective:     Patient ID: Allison Koch, female   DOB: 1956-01-16, 68 y.o.   MRN: [id number]  This visit type was conducted due to national recommendations for restrictions regarding the COVID-19 Pandemic (e.g. social distancing) in an effort to limit this patient's exposure and mitigate transmission in our community.  Due to their co-morbid illnesses, this patient is at least at moderate risk for complications without adequate follow up.  This format is felt to be most appropriate for this patient at this time.    Documentation for virtual audio and video telecommunications through [place] encounter:  The patient was located at home. The provider was located in the office. The patient did consent to this visit and is aware of possible charges through their insurance for this visit.  The other persons participating in this telemedicine service were none. Time spent on call was 20 minutes and in review of previous records 20 minutes total.  This virtual service is not related to other E/M service within previous 7 days.   HPI Chief Complaint  Patient presents with   Acute Visit    Started as Hay Fever fever last Wednesday-itchy throat and sneezing. The itchiness has been gone several days but the congestion has not. I've been taking some OTC Allergy medicine but it isn't doing much good. Had some Clarithromycin  you gave her in March and took one last night to see if it would help. It seemed to break up some of the congestion but this morning by throat is itching.   Virtual for possible sinus infection.   Symptoms began a week ago.  Has had scratchy throat, sneezing, lots of head and nasal congestion.  No SOB.  Some dry cough.  No NVD.  No ear pain.   No fever, no headaches.  Used some left over medication from March. This morning throat was real scratchy again.    Has redness across abdomen that started today that she attributes to clarithromycin .  She has had similar rash with mycins  in the past..  Using OTC allergy medication.    No sick contacts.  No other aggravating or relieving factors. No other complaint.    Past Medical History:  Diagnosis Date   Hypothyroidism    PAF (paroxysmal atrial fibrillation) (HCC)    Noted in setting of acute illness   Tubular adenoma 07/2019   colonoscopy, Dr. Gordy Starch   Current Outpatient Medications on File Prior to Visit  Medication Sig Dispense Refill   aspirin  EC 81 MG tablet Take 1 tablet (81 mg total) by mouth daily. Swallow whole. 30 tablet 12   Calcium  Carbonate-Vitamin D  600-400 MG-UNIT per chew tablet Chew 1 tablet by mouth daily.     estradiol  (VIVELLE -DOT) 0.05 MG/24HR patch estradiol  0.05 mg/24 hr semiweekly transdermal patch  APPLY ONE PATCH ON THE SKIN TWICE A WEEK     levothyroxine  (SYNTHROID ) 50 MCG tablet Take 1 tablet (50 mcg total) by mouth daily. 90 tablet 0   progesterone (PROMETRIUM) 100 MG capsule Take 100 mg by mouth at bedtime.     No current facility-administered medications on file prior to visit.     Review of Systems As in subjective    Objective:   Physical Exam Due to coronavirus pandemic stay at home measures, patient visit was virtual and they were not examined in person.   Wt 115 lb (52.2 kg)   BMI 19.43 kg/m   Gen: wd, wn, nad No labored breathing or wheezing  Assessment:     Encounter Diagnosis  Name Primary?   Acute non-recurrent frontal sinusitis Yes       Plan:     We discussed symptoms and concerns.  Begin Ceftin  antibiotic for sinus infection.  Use the Mucinex D or Mucinex DM over-the-counter for the next 3 to 5 days, good water intake, nasal saline flush.  Can use Tylenol  as needed for pain.  If not much improvement in the next 3 to 5 days then let me know.  If any new rash or potential allergy to antibiotic let me know.  She has several prior reactions to antibiotics.  Raeghan was seen today for acute visit.  Diagnoses and all orders for this visit:  Acute  non-recurrent frontal sinusitis  Other orders -     cefUROXime  (CEFTIN ) 500 MG tablet; Take 1 tablet (500 mg total) by mouth 2 (two) times daily with a meal for 10 days.    F/u prn

## 2024-05-22 ENCOUNTER — Ambulatory Visit: Admitting: Medical

## 2024-05-25 ENCOUNTER — Encounter: Payer: Self-pay | Admitting: Internal Medicine

## 2024-06-01 ENCOUNTER — Telehealth: Payer: Self-pay | Admitting: Internal Medicine

## 2024-06-01 ENCOUNTER — Encounter: Payer: Self-pay | Admitting: Cardiology

## 2024-06-01 NOTE — Telephone Encounter (Signed)
  Cardiology had placed order back in July to have this done. Left detailed message for pt to schedule with cardiology about schedule lab visit  Copied from CRM #[id number]. Topic: Clinical - Lab/Test Results >> Jun 01, 2024 10:42 AM Chiquita SQUIBB wrote: Reason for CRM: Patient is calling in regarding the blood work she had done 08/07, her cardiologist was looking for her cholesterol results but could not find it. Patient is asking if she had this done/ Please advise patient.

## 2024-06-02 ENCOUNTER — Ambulatory Visit: Payer: Self-pay | Admitting: Physician Assistant

## 2024-06-02 ENCOUNTER — Inpatient Hospital Stay: Attending: Physician Assistant

## 2024-06-02 DIAGNOSIS — D75839 Thrombocytosis, unspecified: Secondary | ICD-10-CM | POA: Insufficient documentation

## 2024-06-02 DIAGNOSIS — D72829 Elevated white blood cell count, unspecified: Secondary | ICD-10-CM | POA: Diagnosis not present

## 2024-06-02 DIAGNOSIS — Z1589 Genetic susceptibility to other disease: Secondary | ICD-10-CM | POA: Diagnosis not present

## 2024-06-02 LAB — CBC WITH DIFFERENTIAL/PLATELET
Abs Immature Granulocytes: 0.05 K/uL (ref 0.00–0.07)
Basophils Absolute: 0.1 K/uL (ref 0.0–0.1)
Basophils Relative: 1 %
Eosinophils Absolute: 0.8 K/uL — ABNORMAL HIGH (ref 0.0–0.5)
Eosinophils Relative: 7 %
HCT: 42.3 % (ref 36.0–46.0)
Hemoglobin: 14 g/dL (ref 12.0–15.0)
Immature Granulocytes: 0 %
Lymphocytes Relative: 17 %
Lymphs Abs: 2.1 K/uL (ref 0.7–4.0)
MCH: 31 pg (ref 26.0–34.0)
MCHC: 33.1 g/dL (ref 30.0–36.0)
MCV: 93.6 fL (ref 80.0–100.0)
Monocytes Absolute: 1 K/uL (ref 0.1–1.0)
Monocytes Relative: 8 %
Neutro Abs: 8.2 K/uL — ABNORMAL HIGH (ref 1.7–7.7)
Neutrophils Relative %: 67 %
Platelets: 424 K/uL — ABNORMAL HIGH (ref 150–400)
RBC: 4.52 MIL/uL (ref 3.87–5.11)
RDW: 12.9 % (ref 11.5–15.5)
WBC: 12.3 K/uL — ABNORMAL HIGH (ref 4.0–10.5)
nRBC: 0 % (ref 0.0–0.2)

## 2024-06-02 NOTE — Telephone Encounter (Signed)
 FYI

## 2024-06-16 ENCOUNTER — Other Ambulatory Visit: Payer: Self-pay | Admitting: Obstetrics and Gynecology

## 2024-06-16 DIAGNOSIS — R928 Other abnormal and inconclusive findings on diagnostic imaging of breast: Secondary | ICD-10-CM

## 2024-06-18 ENCOUNTER — Ambulatory Visit
Admission: RE | Admit: 2024-06-18 | Discharge: 2024-06-18 | Disposition: A | Discharge status: Home / Self Care | Source: Ambulatory Visit | Attending: Obstetrics and Gynecology | Admitting: Obstetrics and Gynecology

## 2024-06-18 DIAGNOSIS — R928 Other abnormal and inconclusive findings on diagnostic imaging of breast: Secondary | ICD-10-CM

## 2024-06-28 ENCOUNTER — Other Ambulatory Visit: Payer: Self-pay | Admitting: Medical

## 2024-06-28 DIAGNOSIS — E039 Hypothyroidism, unspecified: Secondary | ICD-10-CM

## 2024-06-29 NOTE — Telephone Encounter (Signed)
 Dose was decreased from 75 to 50mg 

## 2024-07-03 MED ORDER — LEVOTHYROXINE SODIUM 50 MCG PO TABS: 50.0000 ug | ORAL_TABLET | Freq: Every day | ORAL | 0 refills | Status: DC

## 2024-07-03 NOTE — Telephone Encounter (Signed)
 refilled  Copied from CRM [id number]. Topic: Clinical - Prescription Issue >> Jul 03, 2024  9:23 AM Leonette SQUIBB wrote: Reason for CRM: pharmacy needs a new order for the 500 mil of levothyroxine .  She said it was lowered this time but was sent in for 75 ml.

## 2024-07-07 ENCOUNTER — Other Ambulatory Visit (HOSPITAL_COMMUNITY)
Admission: RE | Admit: 2024-07-07 | Discharge: 2024-07-07 | Disposition: A | Discharge status: Home / Self Care | Source: Ambulatory Visit | Attending: Cardiology | Admitting: Cardiology

## 2024-07-07 ENCOUNTER — Ambulatory Visit: Payer: Self-pay | Admitting: Cardiology

## 2024-07-07 DIAGNOSIS — Z8679 Personal history of other diseases of the circulatory system: Secondary | ICD-10-CM | POA: Insufficient documentation

## 2024-07-07 DIAGNOSIS — I6523 Occlusion and stenosis of bilateral carotid arteries: Secondary | ICD-10-CM | POA: Diagnosis not present

## 2024-07-07 LAB — LIPID PANEL
Cholesterol: 200 mg/dL (ref 0–200)
HDL: 59 mg/dL (ref >40)
LDL Cholesterol: 119 mg/dL — ABNORMAL HIGH (ref 0–99)
Total CHOL/HDL Ratio: 3.4 ratio
Triglycerides: 108 mg/dL (ref <150)
VLDL: 22 mg/dL (ref 0–40)

## 2024-07-07 NOTE — Telephone Encounter (Signed)
-----   Message from Jayson Sierras sent at 07/07/2024  3:34 PM EDT ----- Results reviewed.  Lipids obtained showing LDL 119, HDL 59.  We did talk about alternatives to statin therapy at her most recent visit given prior intolerance.  If she wanted to go ahead with an oral  medication instead of an injectable agent, we could try bempedoic acid 180 mg daily. ----- Message ----- From: Rebecka, Lab In [place] Sent: 07/07/2024  10:01 AM EDT To: Jayson KANDICE Sierras, MD

## 2024-07-07 NOTE — Telephone Encounter (Signed)
 Patient wants to know the size of pill before she takes as she has trouble swallowing anything big.

## 2024-07-08 ENCOUNTER — Other Ambulatory Visit: Payer: Self-pay | Admitting: Obstetrics and Gynecology

## 2024-07-08 DIAGNOSIS — R921 Mammographic calcification found on diagnostic imaging of breast: Secondary | ICD-10-CM

## 2024-07-08 MED ORDER — BEMPEDOIC ACID 180 MG PO TABS: 180.0000 mg | ORAL_TABLET | Freq: Every day | ORAL | 11 refills | Status: AC

## 2024-07-08 NOTE — Telephone Encounter (Signed)
 Per pharmacy, pill is about the size of coreg 25 mg. Patient will try, requests 30 day supply to [facility]

## 2024-07-14 ENCOUNTER — Ambulatory Visit (AMBULATORY_SURGERY_CENTER): Admitting: *Deleted

## 2024-07-14 ENCOUNTER — Encounter: Payer: Self-pay | Admitting: Internal Medicine

## 2024-07-14 VITALS — Ht 64.5 in | Wt 116.0 lb

## 2024-07-14 DIAGNOSIS — Z860100 Personal history of colon polyps, unspecified: Secondary | ICD-10-CM

## 2024-07-14 DIAGNOSIS — Z800 Family history of malignant neoplasm of digestive organs: Secondary | ICD-10-CM

## 2024-07-14 MED ORDER — NA SULFATE-K SULFATE-MG SULF 17.5-3.13-1.6 GM/177ML PO SOLN: 1.0000 | Freq: Once | ORAL | 0 refills | Status: AC

## 2024-07-14 NOTE — Progress Notes (Signed)
 Pt's name and DOB verified at the beginning of the pre-visit with 2 identifiers   Pt denies any difficulty with ambulating,sitting, laying down or rolling side to side  Pt has no issues moving head neck or swallowing  No egg or soy allergy known to patient   Pt has reaction of severe burning from first anesthesia used for last colonoscopy. Had to use a different type.  No FH of Malignant Hyperthermia  Pt is not on home 02   Pt is not on blood thinners   Pt denies issues with constipation   Pt is not on dialysis  Pt has hx of Afib x 2 episode  Pt has next Card OV 09/2024 sees MD every 6 month  Patient's chart reviewed by Norleen Schillings CNRA prior to pre-visit and patient appropriate for the [facility].  Pre-visit completed and red dot placed by patient's name on their procedure day (on provider's schedule).    Visit by phone  Pt states weight is 116 lb   Pt given  both [facility] main # and MD on call # prior to instructions.  Informed pt to come in at the time discussed and is shown on PV instructions.  Pt instructed to use Singlecare.com or GoodRx for a price reduction on prep  Instructed pt where to find PV instructions in My Ch. Copy of instructions  to be sent in mail and address read back to pt to verify correct on envelope. Instructed pt on all aspects of written instructions including med holds clothing to wear and foods to eat and not eat as well as after procedure legal restrictions and to call MD on call if needed.. Pt states understanding. Instructed pt to review instructions again prior to procedure and call main # given if has any questions or any issues. Pt states they will.

## 2024-07-21 ENCOUNTER — Other Ambulatory Visit: Payer: Self-pay | Admitting: Medical

## 2024-07-28 ENCOUNTER — Ambulatory Visit (AMBULATORY_SURGERY_CENTER): Admitting: Internal Medicine

## 2024-07-28 ENCOUNTER — Encounter: Payer: Self-pay | Admitting: Internal Medicine

## 2024-07-28 VITALS — BP 112/57 | HR 68 | Temp 97.2°F | Resp 17 | Ht 64.5 in | Wt 116.0 lb

## 2024-07-28 DIAGNOSIS — K635 Polyp of colon: Secondary | ICD-10-CM | POA: Diagnosis not present

## 2024-07-28 DIAGNOSIS — Z860101 Personal history of adenomatous and serrated colon polyps: Secondary | ICD-10-CM | POA: Diagnosis not present

## 2024-07-28 DIAGNOSIS — D122 Benign neoplasm of ascending colon: Secondary | ICD-10-CM

## 2024-07-28 DIAGNOSIS — Z1211 Encounter for screening for malignant neoplasm of colon: Secondary | ICD-10-CM

## 2024-07-28 DIAGNOSIS — E039 Hypothyroidism, unspecified: Secondary | ICD-10-CM | POA: Diagnosis not present

## 2024-07-28 DIAGNOSIS — E785 Hyperlipidemia, unspecified: Secondary | ICD-10-CM | POA: Diagnosis not present

## 2024-07-28 DIAGNOSIS — Z800 Family history of malignant neoplasm of digestive organs: Secondary | ICD-10-CM | POA: Diagnosis not present

## 2024-07-28 DIAGNOSIS — K5730 Diverticulosis of large intestine without perforation or abscess without bleeding: Secondary | ICD-10-CM | POA: Diagnosis not present

## 2024-07-28 DIAGNOSIS — I480 Paroxysmal atrial fibrillation: Secondary | ICD-10-CM | POA: Diagnosis not present

## 2024-07-28 DIAGNOSIS — D120 Benign neoplasm of cecum: Secondary | ICD-10-CM

## 2024-07-28 DIAGNOSIS — Z860100 Personal history of colon polyps, unspecified: Secondary | ICD-10-CM

## 2024-07-28 DIAGNOSIS — I4891 Unspecified atrial fibrillation: Secondary | ICD-10-CM | POA: Diagnosis not present

## 2024-07-28 MED ORDER — SODIUM CHLORIDE 0.9 % IV SOLN: 500.0000 mL | Freq: Once | INTRAVENOUS | Status: DC

## 2024-07-28 NOTE — Progress Notes (Signed)
 Pt's states no medical or surgical changes since previsit or office visit.

## 2024-07-28 NOTE — Progress Notes (Signed)
 Called to room to assist during endoscopic procedure.  Patient ID and intended procedure confirmed with present staff. Received instructions for my participation in the procedure from the performing physician.

## 2024-07-28 NOTE — Progress Notes (Signed)
 Report to PACU, RN, vss, BBS= Clear.

## 2024-07-28 NOTE — Patient Instructions (Signed)
Await pathology results.  Handout on polyps provided.  YOU HAD AN ENDOSCOPIC PROCEDURE TODAY AT THE [facility]:   Refer to the procedure report that was given to you for any specific questions about what was found during the examination.  If the procedure report does not answer your questions, please call your gastroenterologist to clarify.  If you requested that your care partner not be given the details of your procedure findings, then the procedure report has been included in a sealed envelope for you to review at your convenience later.  YOU SHOULD EXPECT: Some feelings of bloating in the abdomen. Passage of more gas than usual.  Walking can help get rid of the air that was put into your GI tract during the procedure and reduce the bloating. If you had a lower endoscopy (such as a colonoscopy or flexible sigmoidoscopy) you may notice spotting of blood in your stool or on the toilet paper. If you underwent a bowel prep for your procedure, you may not have a normal bowel movement for a few days.  Please Note:  You might notice some irritation and congestion in your nose or some drainage.  This is from the oxygen used during your procedure.  There is no need for concern and it should clear up in a day or so.  SYMPTOMS TO REPORT IMMEDIATELY:  Following lower endoscopy (colonoscopy or flexible sigmoidoscopy):  Excessive amounts of blood in the stool  Significant tenderness or worsening of abdominal pains  Swelling of the abdomen that is new, acute  Fever of 100F or higher   For urgent or emergent issues, a gastroenterologist can be reached at any hour by calling [phone]. Do not use MyChart messaging for urgent concerns.    DIET:  We do recommend a small meal at first, but then you may proceed to your regular diet.  Drink plenty of fluids but you should avoid alcoholic beverages for 24 hours.  ACTIVITY:  You should plan to take it easy for the rest of today and you should  NOT DRIVE or use heavy machinery until tomorrow (because of the sedation medicines used during the test).    FOLLOW UP: Our staff will call the number listed on your records the next business day following your procedure.  We will call around 7:15- 8:00 am to check on you and address any questions or concerns that you may have regarding the information given to you following your procedure. If we do not reach you, we will leave a message.     If any biopsies were taken you will be contacted by phone or by letter within the next 1-3 weeks.  Please call us at [phone] if you have not heard about the biopsies in 3 weeks.    SIGNATURES/CONFIDENTIALITY: You and/or your care partner have signed paperwork which will be entered into your electronic medical record.  These signatures attest to the fact that that the information above on your After Visit Summary has been reviewed and is understood.  Full responsibility of the confidentiality of this discharge information lies with you and/or your care-partner.  

## 2024-07-28 NOTE — Op Note (Signed)
 [facility] Patient Name: Allison Koch Procedure Date: 07/28/2024 9:42 AM MRN: [id number] Endoscopist: Gordy CHRISTELLA Starch , MD, [id number] Age: 68 Referring MD:  Date of Birth: [birth year] Gender: Female Account #: [id number] Procedure:                Colonoscopy Indications:              High risk colon cancer surveillance: Personal                            history of multiple adenomas, Family history of                            colon cancer in a first-degree relative (mother)                            after age 48 years, Last colonoscopy: November 2020                            (TA x 4) Medicines:                Monitored Anesthesia Care Procedure:                Pre-Anesthesia Assessment:                           - Prior to the procedure, a History and Physical                            was performed, and patient medications and                            allergies were reviewed. The patient's tolerance of                            previous anesthesia was also reviewed. The risks                            and benefits of the procedure and the sedation                            options and risks were discussed with the patient.                            All questions were answered, and informed consent                            was obtained. Prior Anticoagulants: The patient has                            taken no anticoagulant or antiplatelet agents. ASA                            Grade Assessment: II - A patient with mild systemic  disease. After reviewing the risks and benefits,                            the patient was deemed in satisfactory condition to                            undergo the procedure.                           After obtaining informed consent, the colonoscope                            was passed under direct vision. Throughout the                            procedure, the patient's blood pressure, pulse, and                             oxygen saturations were monitored continuously. The                            Olympus Scope [id number] was introduced through the                            anus and advanced to the cecum, identified by                            appendiceal orifice and ileocecal valve. The                            colonoscopy was technically difficult and complex                            due to restricted mobility of the colon. Successful                            completion of the procedure was aided by applying                            abdominal pressure. The patient tolerated the                            procedure well. The quality of the bowel                            preparation was good. The ileocecal valve,                            appendiceal orifice, and rectum were photographed. Scope In: 9:52:05 AM Scope Out: 10:13:46 AM Scope Withdrawal Time: 0 hours 12 minutes 21 seconds  Total Procedure Duration: 0 hours 21 minutes 41 seconds  Findings:                 The digital rectal exam was normal.  Two sessile polyps were found in the ascending                            colon and cecum. The polyps were 2 to 4 mm in size.                            These polyps were removed with a cold snare.                            Resection and retrieval were complete.                           A few small-mouthed diverticula were found in the                            sigmoid colon.                           Retroflexion in the rectum was not performed due to                            narrow rectal vault. Complications:            No immediate complications. Estimated Blood Loss:     Estimated blood loss: none. Impression:               - Two 2 to 4 mm polyps in the ascending colon and                            in the cecum, removed with a cold snare. Resected                            and retrieved.                           - Mild diverticulosis  in the sigmoid colon. Recommendation:           - Patient has a contact number available for                            emergencies. The signs and symptoms of potential                            delayed complications were discussed with the                            patient. Return to normal activities tomorrow.                            Written discharge instructions were provided to the                            patient.                           -  Resume previous diet.                           - Continue present medications.                           - Await pathology results.                           - Repeat colonoscopy in 5 years for surveillance                            using ultraslim colonoscope. Gordy CHRISTELLA Starch, MD 07/28/2024 10:18:01 AM This report has been signed electronically.

## 2024-07-28 NOTE — Progress Notes (Signed)
 GASTROENTEROLOGY PROCEDURE H&P NOTE   Primary Care Physician: Bulah Alm RAMAN, PA-C    Reason for Procedure:   Hx of colonic polyps, fam hx of colon cancer  Plan:    colonoscopy  Patient is appropriate for endoscopic procedure(s) in the ambulatory ([facility]) setting.  The nature of the procedure, as well as the risks, benefits, and alternatives were carefully and thoroughly reviewed with the patient. Ample time for discussion and questions allowed.  All questions were answered. The patient understood, was satisfied, and agreed with the plan to proceed.    HPI: Allison Koch is a 68 y.o. female who presents for colonoscopy.  Medical history as below.  Tolerated the prep.  No recent chest pain or shortness of breath.  No abdominal pain today.  Past Medical History:  Diagnosis Date   Atrial fibrillation (HCC)    x 2 episodes   Hyperlipidemia    Hypothyroidism    Osteoporosis    PAF (paroxysmal atrial fibrillation) (HCC)    Noted in setting of acute illness   Tubular adenoma 07/2019   colonoscopy, Dr. Gordy Starch    Past Surgical History:  Procedure Laterality Date   COLONOSCOPY  07/2019   Dr. Gordy Starch, tubular adenoma, repeat 5 years   EYE SURGERY     TONSILLECTOMY     UTERINE FIBROID SURGERY     WISDOM TOOTH EXTRACTION      Prior to Admission medications   Medication Sig Start Date End Date Taking? Authorizing Provider  aspirin  EC 81 MG tablet Take 1 tablet (81 mg total) by mouth daily. Swallow whole. 11/18/23  Yes Pennington, Rebekah M, PA-C  Bempedoic Acid 180 MG TABS Take 1 tablet (180 mg total) by mouth daily. 07/08/24  Yes Debera Jayson MATSU, MD  Calcium  Carbonate-Vitamin D  600-400 MG-UNIT per chew tablet Chew 1 tablet by mouth daily.   Yes [provider]  estradiol  (VIVELLE -DOT) 0.05 MG/24HR patch estradiol  0.05 mg/24 hr semiweekly transdermal patch  APPLY ONE PATCH ON THE SKIN TWICE A WEEK   Yes [provider]  levothyroxine  (SYNTHROID ) 50  MCG tablet Take 1 tablet (50 mcg total) by mouth daily. 07/03/24 07/03/25 Yes Tysinger, Alm RAMAN, PA-C  progesterone (PROMETRIUM) 100 MG capsule Take 100 mg by mouth at bedtime. 01/02/22  Yes [provider]    Current Outpatient Medications  Medication Sig Dispense Refill   aspirin  EC 81 MG tablet Take 1 tablet (81 mg total) by mouth daily. Swallow whole. 30 tablet 12   Bempedoic Acid 180 MG TABS Take 1 tablet (180 mg total) by mouth daily. 30 tablet 11   Calcium  Carbonate-Vitamin D  600-400 MG-UNIT per chew tablet Chew 1 tablet by mouth daily.     estradiol  (VIVELLE -DOT) 0.05 MG/24HR patch estradiol  0.05 mg/24 hr semiweekly transdermal patch  APPLY ONE PATCH ON THE SKIN TWICE A WEEK     levothyroxine  (SYNTHROID ) 50 MCG tablet Take 1 tablet (50 mcg total) by mouth daily. 90 tablet 0   progesterone (PROMETRIUM) 100 MG capsule Take 100 mg by mouth at bedtime.     Current Facility-Administered Medications  Medication Dose Route Frequency Provider Last Rate Last Admin   0.9 %  sodium chloride  infusion  500 mL Intravenous Once Manpreet Kemmer, Gordy CHRISTELLA, MD        Allergies as of 07/28/2024 - Review Complete 07/28/2024  Allergen Reaction Noted   Penicillins Swelling 06/25/2011   Sulfa antibiotics Other (See Comments) 02/21/2021   Statins Other (See Comments) 02/13/2023  Clarithromycin  Rash 05/21/2024   Other Rash 08/22/2011    Family History  Problem Relation Age of Onset   Arrhythmia Mother        atrial fib   Colon cancer Mother 70   Dementia Mother    COPD Mother    Cancer Mother    Heart attack Father        CABG x9, maybe in 69s   Heart failure Father        died of CHF   Diabetes Father    Esophageal cancer Neg Hx    Rectal cancer Neg Hx    Stomach cancer Neg Hx    Heart disease Neg Hx    Colon polyps Neg Hx     Social History   Socioeconomic History   Marital status: Married    Spouse name: Not on file   Number of children: Not on file   Years of education: Not on  file   Highest education level: Some college, no degree  Occupational History   Not on file  Tobacco Use   Smoking status: Never   Smokeless tobacco: Never  Vaping Use   Vaping status: Never Used  Substance and Sexual Activity   Alcohol use: No   Drug use: No   Sexual activity: Not on file  Other Topics Concern   Not on file  Social History Narrative   Advertising account executive 4H camp, [place]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   Social Drivers of Health   Financial Resource Strain: Low Risk  (04/19/2024)   Overall Financial Resource Strain (CARDIA)    Difficulty of Paying Living Expenses: Not hard at all  Food Insecurity: No Food Insecurity (04/19/2024)   Hunger Vital Sign    Worried About Running Out of Food in the Last Year: Never true    Ran Out of Food in the Last Year: Never true  Transportation Needs: No Transportation Needs (04/19/2024)   PRAPARE - Administrator, Civil Service (Medical): No    Lack of Transportation (Non-Medical): No  Physical Activity: Sufficiently Active (04/19/2024)   Exercise Vital Sign    Days of Exercise per Week: 4 days    Minutes of Exercise per Session: 70 min  Stress: No Stress Concern Present (04/19/2024)   [employer] of Occupational Health - Occupational Stress Questionnaire    Feeling of Stress: Not at all  Social Connections: Socially Integrated (04/19/2024)   Social Connection and Isolation Panel    Frequency of Communication with Friends and Family: Three times a week    Frequency of Social Gatherings with Friends and Family: Twice a week    Attends Religious Services: More than 4 times per year    Active Member of [employer] or Organizations: Yes    Attends Banker Meetings: 1 to 4 times per year    Marital Status: Married  Catering Manager Violence: Not At Risk (01/28/2024)   Humiliation, Afraid, Rape, and Kick questionnaire    Fear of Current or Ex-Partner: No    Emotionally Abused: No    Physically Abused: No    Sexually Abused: No     Physical Exam: Vital signs in last 24 hours: @BP  (!) 160/66   Pulse 87   Temp (!) 97.2 F (36.2 C) (Skin)   Ht 5' 4.5 (1.638 m)   Wt 116 lb (52.6 kg)   SpO2 97%   BMI 19.60 kg/m  GEN: NAD EYE: Sclerae anicteric ENT: MMM CV: Non-tachycardic Pulm: CTA b/l GI: Soft, NT/ND  NEURO:  Alert & Oriented x 3   Gordy Starch, MD [facility]  07/28/2024 9:41 AM

## 2024-07-29 ENCOUNTER — Telehealth: Payer: Self-pay

## 2024-07-29 NOTE — Telephone Encounter (Signed)
  Follow up Call-     07/28/2024    8:50 AM  Call back number  Post procedure Call Back phone  # [phone]  Permission to leave phone message Yes     Patient questions:  Do you have a fever, pain , or abdominal swelling? No. Pain Score  0 *  Have you tolerated food without any problems? Yes.    Have you been able to return to your normal activities? Yes.    Do you have any questions about your discharge instructions: Diet   No. Medications  No. Follow up visit  No.  Do you have questions or concerns about your Care? No.  Actions: * If pain score is 4 or above: No action needed, pain <4.

## 2024-07-31 LAB — SURGICAL PATHOLOGY

## 2024-08-03 ENCOUNTER — Inpatient Hospital Stay: Attending: Physician Assistant

## 2024-08-03 DIAGNOSIS — D72829 Elevated white blood cell count, unspecified: Secondary | ICD-10-CM

## 2024-08-03 DIAGNOSIS — Z1589 Genetic susceptibility to other disease: Secondary | ICD-10-CM

## 2024-08-03 DIAGNOSIS — D75839 Thrombocytosis, unspecified: Secondary | ICD-10-CM | POA: Diagnosis not present

## 2024-08-03 LAB — CBC WITH DIFFERENTIAL/PLATELET
Abs Immature Granulocytes: 0.04 K/uL (ref 0.00–0.07)
Basophils Absolute: 0.1 K/uL (ref 0.0–0.1)
Basophils Relative: 1 %
Eosinophils Absolute: 0.4 K/uL (ref 0.0–0.5)
Eosinophils Relative: 4 %
HCT: 41.5 % (ref 36.0–46.0)
Hemoglobin: 13.4 g/dL (ref 12.0–15.0)
Immature Granulocytes: 0 %
Lymphocytes Relative: 20 %
Lymphs Abs: 2.1 K/uL (ref 0.7–4.0)
MCH: 29.8 pg (ref 26.0–34.0)
MCHC: 32.3 g/dL (ref 30.0–36.0)
MCV: 92.2 fL (ref 80.0–100.0)
Monocytes Absolute: 0.9 K/uL (ref 0.1–1.0)
Monocytes Relative: 9 %
Neutro Abs: 6.8 K/uL (ref 1.7–7.7)
Neutrophils Relative %: 66 %
Platelets: 375 K/uL (ref 150–400)
RBC: 4.5 MIL/uL (ref 3.87–5.11)
RDW: 12.8 % (ref 11.5–15.5)
WBC: 10.4 K/uL (ref 4.0–10.5)
nRBC: 0 % (ref 0.0–0.2)

## 2024-08-10 ENCOUNTER — Ambulatory Visit: Payer: Self-pay | Admitting: Internal Medicine

## 2024-09-14 ENCOUNTER — Encounter: Payer: Self-pay | Admitting: *Deleted

## 2024-09-28 ENCOUNTER — Inpatient Hospital Stay: Attending: Physician Assistant

## 2024-09-28 DIAGNOSIS — D72829 Elevated white blood cell count, unspecified: Secondary | ICD-10-CM

## 2024-09-28 DIAGNOSIS — Z1589 Genetic susceptibility to other disease: Secondary | ICD-10-CM

## 2024-09-28 LAB — CBC WITH DIFFERENTIAL/PLATELET
Abs Immature Granulocytes: 0.05 K/uL (ref 0.00–0.07)
Basophils Absolute: 0.1 K/uL (ref 0.0–0.1)
Basophils Relative: 1 %
Eosinophils Absolute: 0.2 K/uL (ref 0.0–0.5)
Eosinophils Relative: 1 %
HCT: 42 % (ref 36.0–46.0)
Hemoglobin: 13.6 g/dL (ref 12.0–15.0)
Immature Granulocytes: 0 %
Lymphocytes Relative: 20 %
Lymphs Abs: 2.4 K/uL (ref 0.7–4.0)
MCH: 30.5 pg (ref 26.0–34.0)
MCHC: 32.4 g/dL (ref 30.0–36.0)
MCV: 94.2 fL (ref 80.0–100.0)
Monocytes Absolute: 1 K/uL (ref 0.1–1.0)
Monocytes Relative: 8 %
Neutro Abs: 8.6 K/uL — ABNORMAL HIGH (ref 1.7–7.7)
Neutrophils Relative %: 70 %
Platelets: 411 K/uL — ABNORMAL HIGH (ref 150–400)
RBC: 4.46 MIL/uL (ref 3.87–5.11)
RDW: 13.3 % (ref 11.5–15.5)
WBC: 12.2 K/uL — ABNORMAL HIGH (ref 4.0–10.5)
nRBC: 0 % (ref 0.0–0.2)

## 2024-09-28 LAB — COMPREHENSIVE METABOLIC PANEL WITH GFR
ALT: <5 U/L (ref 0–44)
AST: 26 U/L (ref 15–41)
Albumin: 4.4 g/dL (ref 3.5–5.0)
Alkaline Phosphatase: 50 U/L (ref 38–126)
Anion gap: 16 — ABNORMAL HIGH (ref 5–15)
BUN: 11 mg/dL (ref 8–23)
CO2: 22 mmol/L (ref 22–32)
Calcium: 10 mg/dL (ref 8.9–10.3)
Chloride: 100 mmol/L (ref 98–111)
Creatinine, Ser: 0.94 mg/dL (ref 0.44–1.00)
GFR, Estimated: >60 mL/min
Glucose, Bld: 87 mg/dL (ref 70–99)
Potassium: 3.5 mmol/L (ref 3.5–5.1)
Sodium: 139 mmol/L (ref 135–145)
Total Bilirubin: 0.6 mg/dL (ref 0.0–1.2)
Total Protein: 6.7 g/dL (ref 6.5–8.1)

## 2024-09-28 LAB — LACTATE DEHYDROGENASE: LDH: 194 U/L (ref 105–235)

## 2024-09-28 LAB — URIC ACID: Uric Acid, Serum: 5.3 mg/dL (ref 2.5–7.1)

## 2024-09-29 ENCOUNTER — Other Ambulatory Visit: Payer: Self-pay | Admitting: Medical

## 2024-10-01 NOTE — Progress Notes (Signed)
 "  [facility] [address], [place] [address]   CLINIC:  Medical Oncology/Hematology  PCP:  [name] [address] [place] [address] [phone]   REASON FOR VISIT:  Follow-up for JAK2+ leukocytosis and hypercalcemia   PRIOR THERAPY: None   CURRENT THERAPY: Surveillance   INTERVAL HISTORY:   [name] 69 y.o. female returns for routine follow-up of JAK2+ leukocytosis and mild hypercalcemia.   She was last seen by Pleasant Barefoot PA-C on 04/01/2024.  Overall, she is feeling fairly well.  She has 100% energy and 100% appetite.  She endorses that she is maintaining a stable weight.  She had a head cold in December 2025.  She has not had any recurrent infections or interim steroid medications. She has not noticed any new lumps or bumps. No B symptoms such as fevers, chills, night sweats, or unintentional weight loss.   She denies any vasomotor symptoms such as aquagenic pruritus, Raynaud's phenomenon, erythromelalgia, dizziness, transient vision changes, or new onset peripheral neuropathy.    No prior history of DVT or PE, and no current symptoms concerning for DVT or PE.   She is taking Caltrate supplement once daily (instead of twice daily).    ASSESSMENT & PLAN:  1.  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 - She denies any history of splenectomy.  No systemic steroids.  No history of connective tissue disorders. 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 Normal LDH, normal reticulocytes.  Rheumatoid factor and ANA negative.  Normal ESR and CRP. - WBC trend over the past 6 months has been overall at baseline with WBC ranging from 10.4-13.0 - Platelet trend over the past 6 months overall at baseline,  intermittently elevated platelets up to 424 at most - Most recent labs (09/28/2024): WBC 12.2, ANC 8.6 Platelets 411 Otherwise unremarkable CBC/D with normal hemoglobin and hematocrit Normal CMP, LDH, uric acid - She has been taking aspirin  81 mg daily ever since initial elevation in platelets was noted in February 2025 - No palpable splenomegaly or lymphadenopathy on exam.   - No vasomotor symptoms or B symptoms. - Denies any recent infections or steroids  - We have discussed that she may have very early signs of MPN, with associated risk of thromboembolic events, myelofibrosis, or leukemic transformation. - PLAN: Intermittent leukocytosis overall stable.  Mild intermittent thrombocytosis over the past 6 months. - We will hold off on cytoreductive therapy (Hydrea) at this time, but will check CBC/D every 3 months  - Continue aspirin  81 mg daily (intermittently elevated platelets) - Labs (CBC/D, CMP, LDH, uric acid) + RTC in 6 months  - We will start Hydrea if platelets persistently >400, hematocrit >45%, or WBC >20   2.  Mild hypercalcemia, RESOLVED - Previously had mild hypercalcemia related to taking Caltrate twice daily - Other workup for hypercalcemia was negative - Hypercalcemia resolved after Caltrate reduced to once daily   3.  Other history - Lives at home with husband.  Retired from office type work.  No chemical exposure.  Non-smoker. - Mother had colon cancer.  Paternal aunt had cancer in her foot.  Maternal aunt had brain cancer.  PLAN SUMMARY:  >> CBC/D only in 3 months >> Labs in 6 months = CBC/D, CMP, LDH, uric acid >> OFFICE visit in 6 months (  after labs)    REVIEW OF SYSTEMS:   Review of Systems  Constitutional:  Negative for appetite change, chills, diaphoresis, fatigue, fever and unexpected weight change.  HENT:   Negative for lump/mass and nosebleeds.   Eyes:  Negative for eye problems.  Respiratory:  Negative for cough, hemoptysis and shortness of breath.    Cardiovascular:  Negative for chest pain, leg swelling and palpitations.  Gastrointestinal:  Negative for abdominal pain, blood in stool, constipation, diarrhea, nausea and vomiting.  Genitourinary:  Negative for hematuria.   Skin: Negative.   Neurological:  Negative for dizziness, headaches and light-headedness.  Hematological:  Does not bruise/bleed easily.     PHYSICAL EXAM:  ECOG PERFORMANCE STATUS: 0 - Asymptomatic  Vitals:   10/05/24 1043 10/05/24 1047  BP: (!) 163/73 (!) 165/78  Pulse: 81   Resp: 18   Temp: 99.1 F (37.3 C)   SpO2: 97%    Filed Weights   10/05/24 1043  Weight: 120 lb (54.4 kg)   Physical Exam Constitutional:      Appearance: Normal appearance.  Cardiovascular:     Rate and Rhythm: Normal rate and regular rhythm.     Pulses: Normal pulses.     Heart sounds: Normal heart sounds.  Pulmonary:     Effort: Pulmonary effort is normal.     Breath sounds: Normal breath sounds.  Abdominal:     Palpations: There is no splenomegaly.  Lymphadenopathy:     Comments: No palpable lymphadenopathy or splenomegaly.  Skin:    General: Skin is warm and dry.  Neurological:     General: No focal deficit present.     Mental Status: She is alert and oriented to person, place, and time.  Psychiatric:        Mood and Affect: Mood normal.        Behavior: Behavior normal.    PAST MEDICAL/SURGICAL HISTORY:  Past Medical History:  Diagnosis Date   Atrial fibrillation (HCC)    x 2 episodes   Hyperlipidemia    Hypothyroidism    Osteoporosis    PAF (paroxysmal atrial fibrillation) (HCC)    Noted in setting of acute illness   Tubular adenoma 07/2019   colonoscopy, Dr. Gordy Starch   Past Surgical History:  Procedure Laterality Date   COLONOSCOPY  07/2019   Dr. Gordy Starch, tubular adenoma, repeat 5 years   EYE SURGERY     TONSILLECTOMY     UTERINE FIBROID SURGERY     WISDOM TOOTH EXTRACTION      SOCIAL HISTORY:  Social History   Socioeconomic History    Marital status: Married    Spouse name: Not on file   Number of children: Not on file   Years of education: Not on file   Highest education level: Some college, no degree  Occupational History   Not on file  Tobacco Use   Smoking status: Never   Smokeless tobacco: Never  Vaping Use   Vaping status: Never Used  Substance and Sexual Activity   Alcohol use: No   Drug use: No   Sexual activity: Not on file  Other Topics Concern   Not on file  Social History Narrative   Advertising account executive 4H camp, [place]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   Social Drivers of Health   Tobacco Use: Low Risk (07/28/2024)   Patient History    Smoking Tobacco Use: Never    Smokeless Tobacco Use: Never    Passive Exposure: Not on file  Financial Resource Strain: Low  Risk (04/19/2024)   Overall Financial Resource Strain (CARDIA)    Difficulty of Paying Living Expenses: Not hard at all  Food Insecurity: No Food Insecurity (04/19/2024)   Epic    Worried About Programme Researcher, Broadcasting/film/video in the Last Year: Never true    Ran Out of Food in the Last Year: Never true  Transportation Needs: No Transportation Needs (04/19/2024)   Epic    Lack of Transportation (Medical): No    Lack of Transportation (Non-Medical): No  Physical Activity: Sufficiently Active (04/19/2024)   Exercise Vital Sign    Days of Exercise per Week: 4 days    Minutes of Exercise per Session: 70 min  Stress: No Stress Concern Present (04/19/2024)   [employer] of Occupational Health - Occupational Stress Questionnaire    Feeling of Stress: Not at all  Social Connections: Socially Integrated (04/19/2024)   Social Connection and Isolation Panel    Frequency of Communication with Friends and Family: Three times a week    Frequency of Social Gatherings with Friends and Family: Twice a week    Attends Religious Services: More than 4 times per year    Active Member of [employer] or Organizations: Yes    Attends Banker Meetings: 1 to 4 times per year    Marital  Status: Married  Catering Manager Violence: Not At Risk (01/28/2024)   Humiliation, Afraid, Rape, and Kick questionnaire    Fear of Current or Ex-Partner: No    Emotionally Abused: No    Physically Abused: No    Sexually Abused: No  Depression (PHQ2-9): Low Risk (10/05/2024)   Depression (PHQ2-9)    PHQ-2 Score: 0  Alcohol Screen: Low Risk (01/28/2024)   Alcohol Screen    Last Alcohol Screening Score (AUDIT): 0  Housing: Low Risk (04/19/2024)   Epic    Unable to Pay for Housing in the Last Year: No    Number of Times Moved in the Last Year: 0    Homeless in the Last Year: No  Utilities: Not At Risk (01/28/2024)   AHC Utilities    Threatened with loss of utilities: No  Health Literacy: Adequate Health Literacy (01/28/2024)   B1300 Health Literacy    Frequency of need for help with medical instructions: Never    FAMILY HISTORY:  Family History  Problem Relation Age of Onset   Arrhythmia Mother        atrial fib   Colon cancer Mother 55   Dementia Mother    COPD Mother    Cancer Mother    Heart attack Father        CABG x9, maybe in 28s   Heart failure Father        died of CHF   Diabetes Father    Esophageal cancer Neg Hx    Rectal cancer Neg Hx    Stomach cancer Neg Hx    Heart disease Neg Hx    Colon polyps Neg Hx     CURRENT MEDICATIONS:  Outpatient Encounter Medications as of 10/05/2024  Medication Sig Note   aspirin  EC 81 MG tablet Take 1 tablet (81 mg total) by mouth daily. Swallow whole.    Bempedoic Acid  180 MG TABS Take 1 tablet (180 mg total) by mouth daily.    Calcium  Carbonate-Vitamin D  600-400 MG-UNIT per chew tablet Chew 1 tablet by mouth daily.    ELDERBERRY-VITAMIN C-ZINC PO Take 1 tablet by mouth daily.    estradiol  (VIVELLE -DOT) 0.05 MG/24HR patch estradiol  0.05  mg/24 hr semiweekly transdermal patch  APPLY ONE PATCH ON THE SKIN TWICE A WEEK 01/17/2022: Changed mg 9.05 mg patch   levothyroxine  (SYNTHROID ) 50 MCG tablet TAKE 1 TABLET BY MOUTH EVERY DAY     progesterone (PROMETRIUM) 100 MG capsule Take 100 mg by mouth at bedtime.    No facility-administered encounter medications on file as of 10/05/2024.    ALLERGIES:  Allergies  Allergen Reactions   Penicillins Swelling    May be able to tolerate amoxicillin Lips swelling/Rash   Sulfa Antibiotics Other (See Comments)    Unknown childhood allergy   Statins Other (See Comments)    Myalgias and arthralgias   Clarithromycin  Rash    Rash, can tolerate zpak   Other Rash    Mycins per patient.    LABORATORY DATA:  I have reviewed the labs as listed.  CBC    Component Value Date/Time   WBC 12.2 (H) 09/28/2024 1026   RBC 4.46 09/28/2024 1026   HGB 13.6 09/28/2024 1026   HGB 14.1 02/12/2023 1427   HCT 42.0 09/28/2024 1026   HCT 42.8 02/12/2023 1427   PLT 411 (H) 09/28/2024 1026   PLT 425 02/12/2023 1427   MCV 94.2 09/28/2024 1026   MCV 89 02/12/2023 1427   MCH 30.5 09/28/2024 1026   MCHC 32.4 09/28/2024 1026   RDW 13.3 09/28/2024 1026   RDW 12.8 02/12/2023 1427   LYMPHSABS 2.4 09/28/2024 1026   LYMPHSABS 2.0 02/12/2023 1427   MONOABS 1.0 09/28/2024 1026   EOSABS 0.2 09/28/2024 1026   EOSABS 0.1 02/12/2023 1427   BASOSABS 0.1 09/28/2024 1026   BASOSABS 0.1 02/12/2023 1427      Latest Ref Rng & Units 09/28/2024   10:26 AM 03/25/2024    2:02 PM 11/12/2023   10:23 AM  CMP  Glucose 70 - 99 mg/dL 87  886  96   BUN 8 - 23 mg/dL 11  9  11    Creatinine 0.44 - 1.00 mg/dL 9.05  9.29  9.27   Sodium 135 - 145 mmol/L 139  138  138   Potassium 3.5 - 5.1 mmol/L 3.5  3.8  3.3   Chloride 98 - 111 mmol/L 100  102  105   CO2 22 - 32 mmol/L 22  26  24    Calcium  8.9 - 10.3 mg/dL 89.9  9.4  9.4   Total Protein 6.5 - 8.1 g/dL 6.7  6.7  6.7   Total Bilirubin 0.0 - 1.2 mg/dL 0.6  1.0  1.4   Alkaline Phos 38 - 126 U/L 50  54  49   AST 15 - 41 U/L 26  23  22    ALT 0 - 44 U/L 5  11  10      DIAGNOSTIC IMAGING:  I have independently reviewed the relevant imaging and discussed with the  patient.   WRAP UP:  All questions were answered. The patient knows to call the clinic with any problems, questions or concerns.  Medical decision making: Moderate  Time spent on visit: I spent 20 minutes counseling the patient face to face. The total time spent in the appointment was 30 minutes and more than 50% was on counseling.  Pleasant CHRISTELLA Barefoot, PA-C  10/05/24 11:44 AM   "

## 2024-10-02 ENCOUNTER — Ambulatory Visit: Admitting: Cardiology

## 2024-10-05 ENCOUNTER — Inpatient Hospital Stay: Admitting: Physician Assistant

## 2024-10-05 ENCOUNTER — Telehealth: Payer: Self-pay | Admitting: Cardiology

## 2024-10-05 VITALS — BP 165/78 | HR 81 | Temp 99.1°F | Resp 18 | Ht 65.0 in | Wt 120.0 lb

## 2024-10-05 DIAGNOSIS — Z1589 Genetic susceptibility to other disease: Secondary | ICD-10-CM

## 2024-10-05 DIAGNOSIS — D72829 Elevated white blood cell count, unspecified: Secondary | ICD-10-CM

## 2024-10-05 NOTE — Telephone Encounter (Signed)
 Pt calling to ask if she left her black gloves in the office 12/16. Please advise.

## 2024-10-05 NOTE — Patient Instructions (Signed)
 [facility] **VISIT SUMMARY & IMPORTANT INSTRUCTIONS **   You were seen today by Pleasant Barefoot PA-C for your elevated white blood cells.    Elevated white blood cells & platelets Your white blood cells and platelets are mildly elevated, likely related to your JAK2 mutation. We will check your blood every 3 months. I would like you to continue taking aspirin  81 mg daily to decrease your risk of blood clot, heart attack, and strokes.  Elevated calcium  Your calcium  level has come back to normal. You can continue to take your Caltrate supplement, but should only take it ONCE daily.  FOLLOW-UP APPOINTMENT: Office visit in 6 months  ** Thank you for trusting me with your healthcare!  I strive to provide all of my patients with quality care at each visit.  If you receive a survey for this visit, I would be so grateful to you for taking the time to provide feedback.  Thank you in advance!  ~ Massimo Hartland                                        Dr. Mickiel Davonna Pleasant Barefoot, PA-C          Delon Hope, NP   - - - - - - - - - - - - - - - - - -     Thank you for choosing [facility] at [facility] to provide your oncology and hematology care.  To afford each patient quality time with our provider, please arrive at least 15 minutes before your scheduled appointment time.   If you have a lab appointment with the Cancer Center please come in thru the Main Entrance and check in at the main information desk.  You need to re-schedule your appointment should you arrive 10 or more minutes late.  We strive to give you quality time with our providers, and arriving late affects you and other patients whose appointments are after yours.  Also, if you no show three or more times for appointments you may be dismissed from the clinic at the providers discretion.     Again, thank you for choosing [facility].  Our hope is that  these requests will decrease the amount of time that you wait before being seen by our physicians.       _____________________________________________________________  Should you have questions after your visit to [facility], please contact our office at [phone] and follow the prompts.  Our office hours are 8:00 a.m. and 4:30 p.m. Monday - Friday.  Please note that voicemails left after 4:00 p.m. may not be returned until the following business day.  We are closed weekends and major holidays.  You do have access to a nurse 24-7, just call the main number to the clinic [phone] and do not press any options, hold on the line and a nurse will answer the phone.    For prescription refill requests, have your pharmacy contact our office and allow 72 hours.

## 2024-10-05 NOTE — Telephone Encounter (Signed)
 Spoke to patient and advised that no one has turned gloves in to front desk.

## 2024-10-26 ENCOUNTER — Ambulatory Visit: Admitting: Cardiology

## 2025-01-04 ENCOUNTER — Inpatient Hospital Stay

## 2025-02-02 ENCOUNTER — Ambulatory Visit: Payer: Self-pay

## 2025-04-05 ENCOUNTER — Inpatient Hospital Stay

## 2025-04-12 ENCOUNTER — Inpatient Hospital Stay: Admitting: Physician Assistant

## 2025-05-04 ENCOUNTER — Encounter: Payer: Self-pay | Admitting: Medical
